# Patient Record
Sex: Female | Born: 1990 | Race: White | Hispanic: No | Marital: Single | Smoking: Never smoker
Health system: Southern US, Community
[De-identification: ages and names within clinical notes are randomized; demographics above are authoritative.]

## PROBLEM LIST (undated history)

## (undated) DIAGNOSIS — G90A Postural orthostatic tachycardia syndrome (POTS): Secondary | ICD-10-CM

## (undated) DIAGNOSIS — R569 Unspecified convulsions: Secondary | ICD-10-CM

## (undated) DIAGNOSIS — F4310 Post-traumatic stress disorder, unspecified: Secondary | ICD-10-CM

## (undated) DIAGNOSIS — I498 Other specified cardiac arrhythmias: Secondary | ICD-10-CM

## (undated) DIAGNOSIS — N83209 Unspecified ovarian cyst, unspecified side: Secondary | ICD-10-CM

## (undated) HISTORY — PX: INGUINAL HERNIA REPAIR: SUR1180

## (undated) HISTORY — PX: CHOLECYSTECTOMY: SHX55

---

## 2020-10-23 ENCOUNTER — Encounter: Payer: Self-pay | Admitting: Emergency Medicine

## 2020-10-23 ENCOUNTER — Other Ambulatory Visit: Payer: Self-pay

## 2020-10-23 ENCOUNTER — Observation Stay: Payer: Medicaid Other | Admitting: Anesthesiology

## 2020-10-23 ENCOUNTER — Observation Stay
Admission: EM | Admit: 2020-10-23 | Discharge: 2020-10-24 | Disposition: A | Discharge status: Home / Self Care | Payer: Medicaid Other | Attending: Surgery | Admitting: Surgery

## 2020-10-23 ENCOUNTER — Encounter
Admission: EM | Disposition: A | Discharge status: Home / Self Care | Payer: Self-pay | Source: Home / Self Care | Attending: Emergency Medicine

## 2020-10-23 ENCOUNTER — Emergency Department: Payer: Medicaid Other

## 2020-10-23 DIAGNOSIS — R1013 Epigastric pain: Secondary | ICD-10-CM | POA: Diagnosis present

## 2020-10-23 DIAGNOSIS — Z20822 Contact with and (suspected) exposure to covid-19: Secondary | ICD-10-CM | POA: Diagnosis not present

## 2020-10-23 DIAGNOSIS — K3580 Unspecified acute appendicitis: Principal | ICD-10-CM | POA: Diagnosis present

## 2020-10-23 HISTORY — DX: Postural orthostatic tachycardia syndrome (POTS): G90.A

## 2020-10-23 HISTORY — DX: Unspecified convulsions: R56.9

## 2020-10-23 HISTORY — DX: Post-traumatic stress disorder, unspecified: F43.10

## 2020-10-23 HISTORY — DX: Other specified cardiac arrhythmias: I49.8

## 2020-10-23 HISTORY — PX: LAPAROSCOPIC APPENDECTOMY: SHX408

## 2020-10-23 LAB — URINALYSIS, COMPLETE (UACMP) WITH MICROSCOPIC
Bilirubin Urine: NEGATIVE
Glucose, UA: NEGATIVE mg/dL
Hgb urine dipstick: NEGATIVE
Ketones, ur: NEGATIVE mg/dL
Leukocytes,Ua: NEGATIVE
Nitrite: NEGATIVE
Protein, ur: NEGATIVE mg/dL
Specific Gravity, Urine: 1.014 (ref 1.005–1.030)
pH: 6 (ref 5.0–8.0)

## 2020-10-23 LAB — HEPATIC FUNCTION PANEL
ALT: 18 U/L (ref 0–44)
AST: 22 U/L (ref 15–41)
Albumin: 4.7 g/dL (ref 3.5–5.0)
Alkaline Phosphatase: 50 U/L (ref 38–126)
Bilirubin, Direct: 0.1 mg/dL (ref 0.0–0.2)
Indirect Bilirubin: 0.6 mg/dL (ref 0.3–0.9)
Total Bilirubin: 0.7 mg/dL (ref 0.3–1.2)
Total Protein: 7.8 g/dL (ref 6.5–8.1)

## 2020-10-23 LAB — BASIC METABOLIC PANEL
Anion gap: 11 (ref 5–15)
BUN: 14 mg/dL (ref 6–20)
CO2: 24 mmol/L (ref 22–32)
Calcium: 9.3 mg/dL (ref 8.9–10.3)
Chloride: 102 mmol/L (ref 98–111)
Creatinine, Ser: 0.69 mg/dL (ref 0.44–1.00)
GFR, Estimated: >60 mL/min (ref >60)
Glucose, Bld: 96 mg/dL (ref 70–99)
Potassium: 4 mmol/L (ref 3.5–5.1)
Sodium: 137 mmol/L (ref 135–145)

## 2020-10-23 LAB — SARS CORONAVIRUS 2 BY RT PCR (HOSPITAL ORDER, PERFORMED IN ~~LOC~~ HOSPITAL LAB): SARS Coronavirus 2: NEGATIVE

## 2020-10-23 LAB — SURGICAL PCR SCREEN
MRSA, PCR: NEGATIVE
Staphylococcus aureus: NEGATIVE

## 2020-10-23 LAB — CBC
HCT: 39.2 % (ref 36.0–46.0)
Hemoglobin: 13.4 g/dL (ref 12.0–15.0)
MCH: 31.3 pg (ref 26.0–34.0)
MCHC: 34.2 g/dL (ref 30.0–36.0)
MCV: 91.6 fL (ref 80.0–100.0)
Platelets: 191 10*3/uL (ref 150–400)
RBC: 4.28 MIL/uL (ref 3.87–5.11)
RDW: 13.2 % (ref 11.5–15.5)
WBC: 13 10*3/uL — ABNORMAL HIGH (ref 4.0–10.5)
nRBC: 0 % (ref 0.0–0.2)

## 2020-10-23 LAB — PREGNANCY, URINE: Preg Test, Ur: NEGATIVE

## 2020-10-23 LAB — LIPASE, BLOOD: Lipase: 26 U/L (ref 11–51)

## 2020-10-23 SURGERY — APPENDECTOMY, LAPAROSCOPIC
Anesthesia: General

## 2020-10-23 MED ORDER — FENTANYL CITRATE (PF) 100 MCG/2ML IJ SOLN
INTRAMUSCULAR | Status: AC
  Filled 2020-10-23: qty 2

## 2020-10-23 MED ORDER — LIDOCAINE HCL (CARDIAC) PF 100 MG/5ML IV SOSY
PREFILLED_SYRINGE | INTRAVENOUS | Status: DC | PRN
  Administered 2020-10-23: 60 mg via INTRAVENOUS

## 2020-10-23 MED ORDER — SODIUM CHLORIDE 0.9 % IV SOLN: INTRAVENOUS | Status: DC

## 2020-10-23 MED ORDER — SODIUM CHLORIDE 0.9 % IV SOLN
12.5000 mg | Freq: Once | INTRAVENOUS | Status: AC
  Administered 2020-10-23: 12.5 mg via INTRAVENOUS
  Filled 2020-10-23: qty 0.5

## 2020-10-23 MED ORDER — OXYCODONE HCL 5 MG/5ML PO SOLN
5.0000 mg | Freq: Once | ORAL | Status: AC | PRN
Start: 2020-10-23 — End: 2020-10-23

## 2020-10-23 MED ORDER — MEPERIDINE HCL 25 MG/ML IJ SOLN
INTRAMUSCULAR | Status: AC
  Filled 2020-10-23: qty 1

## 2020-10-23 MED ORDER — BUPIVACAINE LIPOSOME 1.3 % IJ SUSP
INTRAMUSCULAR | Status: AC
  Filled 2020-10-23: qty 20

## 2020-10-23 MED ORDER — DEXAMETHASONE SODIUM PHOSPHATE 10 MG/ML IJ SOLN
INTRAMUSCULAR | Status: DC | PRN
  Administered 2020-10-23: 10 mg via INTRAVENOUS

## 2020-10-23 MED ORDER — ENOXAPARIN SODIUM 40 MG/0.4ML IJ SOSY
40.0000 mg | PREFILLED_SYRINGE | INTRAMUSCULAR | Status: DC
  Administered 2020-10-24: 40 mg via SUBCUTANEOUS
  Filled 2020-10-23: qty 0.4

## 2020-10-23 MED ORDER — HYDROMORPHONE HCL 1 MG/ML IJ SOLN
0.5000 mg | INTRAMUSCULAR | Status: DC | PRN
  Administered 2020-10-23 – 2020-10-24 (×5): 1 mg via INTRAVENOUS
  Filled 2020-10-23 (×5): qty 1

## 2020-10-23 MED ORDER — ONDANSETRON HCL 4 MG/2ML IJ SOLN
4.0000 mg | Freq: Once | INTRAMUSCULAR | Status: AC
  Administered 2020-10-23: 4 mg via INTRAVENOUS
  Filled 2020-10-23: qty 2

## 2020-10-23 MED ORDER — ROCURONIUM BROMIDE 100 MG/10ML IV SOLN
INTRAVENOUS | Status: DC | PRN
Start: 2020-10-23 — End: 2020-10-23
  Administered 2020-10-23: 30 mg via INTRAVENOUS

## 2020-10-23 MED ORDER — PROPOFOL 10 MG/ML IV BOLUS
INTRAVENOUS | Status: DC | PRN
  Administered 2020-10-23: 150 mg via INTRAVENOUS
  Administered 2020-10-23: 50 mg via INTRAVENOUS

## 2020-10-23 MED ORDER — IOHEXOL 350 MG/ML SOLN
80.0000 mL | Freq: Once | INTRAVENOUS | Status: AC | PRN
  Administered 2020-10-23: 80 mL via INTRAVENOUS
  Filled 2020-10-23: qty 80

## 2020-10-23 MED ORDER — BUPIVACAINE-EPINEPHRINE 0.25% -1:200000 IJ SOLN
INTRAMUSCULAR | Status: DC | PRN
  Administered 2020-10-23: 30 mL

## 2020-10-23 MED ORDER — FENTANYL CITRATE (PF) 100 MCG/2ML IJ SOLN
INTRAMUSCULAR | Status: DC | PRN
  Administered 2020-10-23: 100 ug via INTRAVENOUS

## 2020-10-23 MED ORDER — FENTANYL CITRATE (PF) 100 MCG/2ML IJ SOLN
INTRAMUSCULAR | Status: AC
  Administered 2020-10-23: 25 ug via INTRAVENOUS
  Filled 2020-10-23: qty 2

## 2020-10-23 MED ORDER — OXYCODONE HCL 5 MG PO TABS
5.0000 mg | ORAL_TABLET | ORAL | Status: DC | PRN
  Administered 2020-10-23: 5 mg via ORAL
  Administered 2020-10-24 (×2): 10 mg via ORAL
  Filled 2020-10-23: qty 1
  Filled 2020-10-23 (×2): qty 2

## 2020-10-23 MED ORDER — METRONIDAZOLE 500 MG/100ML IV SOLN
500.0000 mg | Freq: Once | INTRAVENOUS | Status: AC
  Administered 2020-10-23: 500 mg via INTRAVENOUS
  Filled 2020-10-23: qty 100

## 2020-10-23 MED ORDER — OXYCODONE HCL 5 MG PO TABS
ORAL_TABLET | ORAL | Status: AC
  Administered 2020-10-23: 5 mg via ORAL
  Filled 2020-10-23: qty 1

## 2020-10-23 MED ORDER — PIPERACILLIN-TAZOBACTAM 3.375 G IVPB
3.3750 g | Freq: Three times a day (TID) | INTRAVENOUS | Status: DC
  Administered 2020-10-24: 3.375 g via INTRAVENOUS
  Filled 2020-10-23 (×2): qty 50

## 2020-10-23 MED ORDER — BUPIVACAINE-EPINEPHRINE (PF) 0.25% -1:200000 IJ SOLN
INTRAMUSCULAR | Status: AC
  Filled 2020-10-23: qty 30

## 2020-10-23 MED ORDER — LACTATED RINGERS IV SOLN: INTRAVENOUS | Status: DC | PRN

## 2020-10-23 MED ORDER — MORPHINE SULFATE (PF) 4 MG/ML IV SOLN
4.0000 mg | Freq: Once | INTRAVENOUS | Status: AC
  Administered 2020-10-23: 4 mg via INTRAVENOUS
  Filled 2020-10-23: qty 1

## 2020-10-23 MED ORDER — FENTANYL CITRATE (PF) 100 MCG/2ML IJ SOLN
25.0000 ug | INTRAMUSCULAR | Status: DC | PRN
  Administered 2020-10-23: 25 ug via INTRAVENOUS
  Administered 2020-10-23: 50 ug via INTRAVENOUS
  Administered 2020-10-23: 25 ug via INTRAVENOUS

## 2020-10-23 MED ORDER — MEPERIDINE HCL 25 MG/ML IJ SOLN
12.5000 mg | Freq: Once | INTRAMUSCULAR | Status: AC
  Administered 2020-10-23: 12.5 mg via INTRAVENOUS

## 2020-10-23 MED ORDER — SUCCINYLCHOLINE CHLORIDE 200 MG/10ML IV SOSY
PREFILLED_SYRINGE | INTRAVENOUS | Status: DC | PRN
  Administered 2020-10-23: 100 mg via INTRAVENOUS

## 2020-10-23 MED ORDER — OXYCODONE HCL 5 MG PO TABS: 5.0000 mg | ORAL_TABLET | Freq: Once | ORAL | Status: AC | PRN

## 2020-10-23 MED ORDER — ACETAMINOPHEN 500 MG PO TABS
1000.0000 mg | ORAL_TABLET | Freq: Four times a day (QID) | ORAL | Status: DC
  Administered 2020-10-23 – 2020-10-24 (×5): 1000 mg via ORAL
  Filled 2020-10-23 (×5): qty 2

## 2020-10-23 MED ORDER — KETOROLAC TROMETHAMINE 30 MG/ML IJ SOLN
30.0000 mg | Freq: Four times a day (QID) | INTRAMUSCULAR | Status: DC
Start: 2020-10-24 — End: 2020-10-24
  Administered 2020-10-23 – 2020-10-24 (×2): 30 mg via INTRAVENOUS
  Filled 2020-10-23 (×3): qty 1

## 2020-10-23 MED ORDER — ONDANSETRON HCL 4 MG/2ML IJ SOLN
4.0000 mg | Freq: Four times a day (QID) | INTRAMUSCULAR | Status: DC | PRN
  Administered 2020-10-23 – 2020-10-24 (×3): 4 mg via INTRAVENOUS
  Filled 2020-10-23 (×3): qty 2

## 2020-10-23 MED ORDER — MIDAZOLAM HCL 2 MG/2ML IJ SOLN
INTRAMUSCULAR | Status: DC | PRN
  Administered 2020-10-23: 2 mg via INTRAVENOUS

## 2020-10-23 MED ORDER — MUPIROCIN 2 % EX OINT
1.0000 "application " | TOPICAL_OINTMENT | Freq: Two times a day (BID) | CUTANEOUS | Status: DC
  Filled 2020-10-23: qty 22

## 2020-10-23 MED ORDER — PHENYLEPHRINE HCL (PRESSORS) 10 MG/ML IV SOLN
INTRAVENOUS | Status: DC | PRN
Start: 2020-10-23 — End: 2020-10-23
  Administered 2020-10-23: 100 ug via INTRAVENOUS

## 2020-10-23 MED ORDER — DROPERIDOL 2.5 MG/ML IJ SOLN
0.6250 mg | Freq: Once | INTRAMUSCULAR | Status: DC | PRN
  Filled 2020-10-23: qty 2

## 2020-10-23 MED ORDER — PIPERACILLIN-TAZOBACTAM 3.375 G IVPB 30 MIN
3.3750 g | Freq: Once | INTRAVENOUS | Status: AC
  Administered 2020-10-23: 3.375 g via INTRAVENOUS
  Filled 2020-10-23: qty 50

## 2020-10-23 MED ORDER — SUGAMMADEX SODIUM 500 MG/5ML IV SOLN
INTRAVENOUS | Status: DC | PRN
Start: 2020-10-23 — End: 2020-10-23
  Administered 2020-10-23: 200 mg via INTRAVENOUS

## 2020-10-23 MED ORDER — CEFAZOLIN SODIUM-DEXTROSE 2-3 GM-%(50ML) IV SOLR
INTRAVENOUS | Status: DC | PRN
  Administered 2020-10-23: 2 g via INTRAVENOUS

## 2020-10-23 MED ORDER — ONDANSETRON 4 MG PO TBDP: 4.0000 mg | ORAL_TABLET | Freq: Four times a day (QID) | ORAL | Status: DC | PRN

## 2020-10-23 MED ORDER — MIDAZOLAM HCL 2 MG/2ML IJ SOLN
INTRAMUSCULAR | Status: AC
  Filled 2020-10-23: qty 2

## 2020-10-23 MED ORDER — BUPIVACAINE LIPOSOME 1.3 % IJ SUSP
INTRAMUSCULAR | Status: DC | PRN
  Administered 2020-10-23: 20 mL

## 2020-10-23 SURGICAL SUPPLY — 41 items
APPLIER CLIP 5 13 M/L LIGAMAX5 (MISCELLANEOUS)
BLADE CLIPPER SURG (BLADE) IMPLANT
CANISTER SUCT 1200ML W/VALVE (MISCELLANEOUS) IMPLANT
CHLORAPREP W/TINT 26 (MISCELLANEOUS) ×2 IMPLANT
CLIP APPLIE 5 13 M/L LIGAMAX5 (MISCELLANEOUS) IMPLANT
CUTTER FLEX LINEAR 45M (STAPLE) ×2 IMPLANT
DECANTER SPIKE VIAL GLASS SM (MISCELLANEOUS) ×2 IMPLANT
DERMABOND ADVANCED (GAUZE/BANDAGES/DRESSINGS) ×1
DERMABOND ADVANCED .7 DNX12 (GAUZE/BANDAGES/DRESSINGS) ×1 IMPLANT
ELECT CAUTERY BLADE 6.4 (BLADE) ×2 IMPLANT
ELECT REM PT RETURN 9FT ADLT (ELECTROSURGICAL) ×2
ELECTRODE REM PT RTRN 9FT ADLT (ELECTROSURGICAL) ×1 IMPLANT
GAUZE 4X4 16PLY ~~LOC~~+RFID DBL (SPONGE) IMPLANT
GLOVE SURG ENC MOIS LTX SZ7 (GLOVE) ×6 IMPLANT
GOWN STRL REUS W/ TWL LRG LVL3 (GOWN DISPOSABLE) ×3 IMPLANT
GOWN STRL REUS W/TWL LRG LVL3 (GOWN DISPOSABLE) ×3
IRRIGATION STRYKERFLOW (MISCELLANEOUS) ×1 IMPLANT
IRRIGATOR STRYKERFLOW (MISCELLANEOUS) ×2
IV NS 1000ML (IV SOLUTION) ×1
IV NS 1000ML BAXH (IV SOLUTION) ×1 IMPLANT
MANIFOLD NEPTUNE II (INSTRUMENTS) ×2 IMPLANT
NEEDLE HYPO 22GX1.5 SAFETY (NEEDLE) ×2 IMPLANT
NS IRRIG 500ML POUR BTL (IV SOLUTION) ×2 IMPLANT
PACK LAP CHOLECYSTECTOMY (MISCELLANEOUS) ×2 IMPLANT
PENCIL ELECTRO HAND CTR (MISCELLANEOUS) ×2 IMPLANT
POUCH SPECIMEN RETRIEVAL 10MM (ENDOMECHANICALS) ×2 IMPLANT
RELOAD 45 VASCULAR/THIN (ENDOMECHANICALS) IMPLANT
RELOAD STAPLE TA45 3.5 REG BLU (ENDOMECHANICALS) ×4 IMPLANT
SCISSORS METZENBAUM CVD 33 (INSTRUMENTS) IMPLANT
SHEARS HARMONIC ACE PLUS 36CM (ENDOMECHANICALS) ×2 IMPLANT
SLEEVE ENDOPATH XCEL 5M (ENDOMECHANICALS) ×2 IMPLANT
SLEEVE SCD COMPRESS KNEE MED (STOCKING) ×2 IMPLANT
SPONGE T-LAP 18X18 ~~LOC~~+RFID (SPONGE) ×2 IMPLANT
SUT MNCRL AB 4-0 PS2 18 (SUTURE) ×2 IMPLANT
SUT VICRYL 0 AB UR-6 (SUTURE) ×4 IMPLANT
SYR 20ML LL LF (SYRINGE) ×2 IMPLANT
TRAY FOLEY MTR SLVR 16FR STAT (SET/KITS/TRAYS/PACK) IMPLANT
TROCAR XCEL BLUNT TIP 100MML (ENDOMECHANICALS) ×2 IMPLANT
TROCAR XCEL NON-BLD 5MMX100MML (ENDOMECHANICALS) ×2 IMPLANT
TUBING EVAC SMOKE HEATED PNEUM (TUBING) ×2 IMPLANT
WATER STERILE IRR 500ML POUR (IV SOLUTION) IMPLANT

## 2020-10-23 NOTE — Anesthesia Procedure Notes (Signed)
Procedure Name: Intubation Date/Time: 10/23/2020 9:00 PM Performed by: Waldo Laine, CRNA Pre-anesthesia Checklist: Patient identified, Patient being monitored, Timeout performed, Emergency Drugs available and Suction available Patient Re-evaluated:Patient Re-evaluated prior to induction Oxygen Delivery Method: Circle system utilized Preoxygenation: Pre-oxygenation with 100% oxygen Induction Type: IV induction and Rapid sequence Laryngoscope Size: 3 and McGraph Grade View: Grade I Tube type: Oral Tube size: 6.5 mm Number of attempts: 1 Placement Confirmation: ETT inserted through vocal cords under direct vision, positive ETCO2 and breath sounds checked- equal and bilateral Secured at: 21 cm Tube secured with: Tape Dental Injury: Teeth and Oropharynx as per pre-operative assessment  Comments: Unremarkable RSI, MV not attempted

## 2020-10-23 NOTE — ED Notes (Signed)
Back from CT scan  States she is starting to feel nauseated

## 2020-10-23 NOTE — Transfer of Care (Signed)
Immediate Anesthesia Transfer of Care Note  Patient: Amy Salas  Procedure(s) Performed: APPENDECTOMY LAPAROSCOPIC  Patient Location: PACU  Anesthesia Type:General  Level of Consciousness: drowsy and patient cooperative  Airway & Oxygen Therapy: Patient Spontanous Breathing  Post-op Assessment: Report given to RN and Post -op Vital signs reviewed and stable  Post vital signs: Reviewed and stable  Last Vitals:  Vitals Value Taken Time  BP 101/70 10/23/20 2131  Temp    Pulse 89 10/23/20 2137  Resp 13 10/23/20 2137  SpO2 98 % 10/23/20 2137  Vitals shown include unvalidated device data.  Last Pain:  Vitals:   10/23/20 1943  TempSrc:   PainSc: 3       Patients Stated Pain Goal: 2 (10/23/20 1843)  Complications: No notable events documented.

## 2020-10-23 NOTE — Anesthesia Preprocedure Evaluation (Signed)
Anesthesia Evaluation  Patient identified by MRN, date of birth, ID band Patient awake    Reviewed: Allergy & Precautions, NPO status , Patient's Chart, lab work & pertinent test results  History of Anesthesia Complications Negative for: history of anesthetic complications  Airway Mallampati: I  TM Distance: >3 FB Neck ROM: Full    Dental no notable dental hx. (+) Teeth Intact   Pulmonary neg pulmonary ROS, neg sleep apnea, neg COPD, Patient abstained from smoking.Not current smoker,    Pulmonary exam normal breath sounds clear to auscultation       Cardiovascular Exercise Tolerance: Good METS(-) hypertension(-) CAD and (-) Past MI (-) dysrhythmias  Rhythm:Regular Rate:Normal - Systolic murmurs POTS, not on meds   Neuro/Psych PSYCHIATRIC DISORDERS Anxiety negative neurological ROS     GI/Hepatic neg GERD  ,(+)     (-) substance abuse  ,   Endo/Other  neg diabetes  Renal/GU negative Renal ROS     Musculoskeletal Patient says one time she woke up from sleep with her legs "paralyzed" and couldn't walk for two months. She says she had her calf muscles biopsied but never got a definitive answer as to what was wrong. Seems to have tolerated prior general anesthetics though no records of them here (hernia repairs, cyst removals)   Abdominal   Peds  Hematology   Anesthesia Other Findings Past Medical History: No date: POTS (postural orthostatic tachycardia syndrome) No date: PTSD (post-traumatic stress disorder) No date: Seizure (HCC)  Reproductive/Obstetrics                             Anesthesia Physical Anesthesia Plan  ASA: 2  Anesthesia Plan: General   Post-op Pain Management:    Induction: Intravenous and Rapid sequence  PONV Risk Score and Plan: 4 or greater and Ondansetron, Dexamethasone and Midazolam  Airway Management Planned: Oral ETT  Additional Equipment: None  Intra-op  Plan:   Post-operative Plan: Extubation in OR  Informed Consent: I have reviewed the patients History and Physical, chart, labs and discussed the procedure including the risks, benefits and alternatives for the proposed anesthesia with the patient or authorized representative who has indicated his/her understanding and acceptance.     Dental advisory given  Plan Discussed with: CRNA and Surgeon  Anesthesia Plan Comments: (Discussed risks of anesthesia with patient, including PONV, sore throat, lip/dental damage. Rare risks discussed as well, such as cardiorespiratory and neurological sequelae, and allergic reactions. Patient understands.)        Anesthesia Quick Evaluation

## 2020-10-23 NOTE — ED Provider Notes (Signed)
[facility] Emergency Department Provider Note   ____________________________________________   Event Date/Time   First MD Initiated Contact with Patient 10/23/20 [phone]     (approximate)  I have reviewed the triage vital signs and the nursing notes.   HISTORY  Chief Complaint Emesis and Flank Pain    HPI Amy Salas is a 30 y.o. female with past medical history of PTSD, POTS, and remote seizures who presents to the ED complaining of abdominal pain.  Patient reports that she was woken from sleep last night with pain originating in her epigastrium and radiating around both sides of her upper abdomen into both flanks.  Pain has been constant and severe since then, associated with nausea and vomiting.  She denies any associated diarrhea and has not had any dysuria, vaginal bleeding, or vaginal discharge.  She reports similar pain about 2 years ago, but was never told what the cause was.  She still has her gallbladder, denies ever being told that she has gallstones in the past.  She denies significant alcohol abuse or NSAID use.  She has not had any fevers, cough, chest pain, or shortness of breath.        Past Medical History:  Diagnosis Date   POTS (postural orthostatic tachycardia syndrome)    PTSD (post-traumatic stress disorder)    Seizure (HCC)     There are no problems to display for this patient.   Past Surgical History:  Procedure Laterality Date   INGUINAL HERNIA REPAIR Bilateral     Prior to Admission medications   Not on File    Allergies Bactrim [sulfamethoxazole-trimethoprim] and Sulfa antibiotics  No family history on file.  Social History    Review of Systems  Constitutional: No fever/chills Eyes: No visual changes. ENT: No sore throat. Cardiovascular: Denies chest pain. Respiratory: Denies shortness of breath. Gastrointestinal: Positive for abdominal pain, nausea, and vomiting.  No diarrhea.  No  constipation. Genitourinary: Negative for dysuria. Musculoskeletal: Negative for back pain. Skin: Negative for rash. Neurological: Negative for headaches, focal weakness or numbness.  ____________________________________________   PHYSICAL EXAM:  VITAL SIGNS: ED Triage Vitals [10/23/20 0923]  Enc Vitals Group     BP 108/82     Pulse Rate 82     Resp 18     Temp 98.5 F (36.9 C)     Temp Source Oral     SpO2 98 %     Weight 130 lb (59 kg)     Height 5\' 9"  (1.753 m)     Head Circumference      Peak Flow      Pain Score 10     Pain Loc      Pain Edu?      Excl. in GC?     Constitutional: Alert and oriented. Eyes: Conjunctivae are normal. Head: Atraumatic. Nose: No congestion/rhinnorhea. Mouth/Throat: Mucous membranes are moist. Neck: Normal ROM Cardiovascular: Normal rate, regular rhythm. Grossly normal heart sounds. Respiratory: Normal respiratory effort.  No retractions. Lungs CTAB. Gastrointestinal: Soft and diffusely tender to palpation, greatest in the epigastrium.  CVA tenderness noted bilaterally. No distention. Genitourinary: deferred Musculoskeletal: No lower extremity tenderness nor edema. Neurologic:  Normal speech and language. No gross focal neurologic deficits are appreciated. Skin:  Skin is warm, dry and intact. No rash noted. Psychiatric: Mood and affect are normal. Speech and behavior are normal.  ____________________________________________   LABS (all labs ordered are listed, but only abnormal results are displayed)  Labs Reviewed  URINALYSIS, COMPLETE (UACMP) WITH MICROSCOPIC - Abnormal; Notable for the following components:      Result Value   Color, Urine YELLOW (*)    APPearance HAZY (*)    Bacteria, UA RARE (*)    All other components within normal limits  CBC - Abnormal; Notable for the following components:   WBC 13.0 (*)    All other components within normal limits  BASIC METABOLIC PANEL  HEPATIC FUNCTION PANEL  LIPASE, BLOOD   PREGNANCY, URINE    PROCEDURES  Procedure(s) performed (including Critical Care):  Procedures   ____________________________________________   INITIAL IMPRESSION / ASSESSMENT AND PLAN / ED COURSE      30 year old female with past medical history of POTS, PTSD, remote seizures who presents to the ED complaining of cute onset abdominal pain overnight associated with nausea and multiple episodes of vomiting.  Patient has diffuse tenderness on exam, we will further assess with labs, UA, pregnancy testing, and CT scan.  Differential includes cholecystitis, biliary colic, pancreatitis, gastritis, pyelonephritis, and kidney stones.  We will treat symptomatically with IV morphine and Zofran, reassess following CT.  CT scan concerning for acute appendicitis with appendiceal dilation and associated inflammatory changes.  We will give dose of IV Zosyn, patient complains of ongoing pain and nausea which we will treat with Phenergan and additional morphine.  Case discussed with Dr. Everlene Farrier of general surgery, who will evaluate the patient.      ____________________________________________   FINAL CLINICAL IMPRESSION(S) / ED DIAGNOSES  Final diagnoses:  Acute appendicitis, unspecified acute appendicitis type     ED Discharge Orders     None        Note:  This document was prepared using Dragon voice recognition software and may include unintentional dictation errors.    Chesley Noon, MD 10/23/20 [phone]

## 2020-10-23 NOTE — ED Triage Notes (Signed)
Pt here with left flank pain and emesis. PT states the pain started last night and is central in nature and does not radiate. Pt tearful in triage.

## 2020-10-23 NOTE — Op Note (Signed)
laparascopic appendectomy   [name] Date of operation:  10/23/2020  Indications: The patient presented with a history of  abdominal pain. Workup has revealed findings consistent with acute appendicitis.  Pre-operative Diagnosis: Acute appendicitis without mention of peritonitis  Post-operative Diagnosis: Same  Surgeon: Sterling Big, MD, FACS  Anesthesia: General with endotracheal tube  Findings: Acute non perforated appendicitis  Estimated Blood Loss: 5cc         Specimens: appendix         Complications:  none  Procedure Details  The patient was seen again in the preop area. The options of surgery versus observation were reviewed with the patient and/or family. The risks of bleeding, infection, recurrence of symptoms, negative laparoscopy, potential for an open procedure, bowel injury, abscess or infection, were all reviewed as well. The patient was taken to Operating Room, identified as Amy Salas and the procedure verified as laparoscopic appendectomy. A Time Out was held and the above information confirmed.  The patient was placed in the supine position and general anesthesia was induced.  Antibiotic prophylaxis was administered and VT E prophylaxis was in place.  The abdomen was prepped and draped in a sterile fashion. An infraumbilical incision was made. A cutdown technique was used to enter the abdominal cavity. Two vicryl stitches were placed on the fascia and a Hasson trocar inserted. Pneumoperitoneum obtained. Two 5 mm ports were placed under direct visualization.  The appendix was identified and found to be acutely inflamed  The appendix was carefully dissected. The mesoappendix was divided withHarmonic scalpel. The base of the appendix was dissected out and divided with a standard load Endo GIA.The appendix was placed in a Endo Catch bag and removed via the Hasson port. The right lower quadrant and pelvis was then irrigated with  normal  saline which was aspirated. Inspection  failed to identify any additional bleeding and there were no signs of bowel injury. Again the right lower quadrant was inspected there was no sign of bleeding or bowel injury therefore pneumoperitoneum was released, all ports were removed.  The umbilical fascia was closed with 0 Vicryl interrupted sutures and the skin incisions were approximated with subcuticular 4-0 Monocryl. Dermabond was placed The patient tolerated the procedure well, there were no complications. The sponge lap and needle count were correct at the end of the procedure.  The patient was taken to the recovery room in stable condition to be admitted for continued care.    Sterling Big, MD FACS

## 2020-10-23 NOTE — H&P (Signed)
[facility] SURGICAL HISTORY & PHYSICAL (cpt [phone])  HISTORY OF PRESENT ILLNESS (HPI):  30 y.o. female presented to [facility] ED today for abdominal pain. Patient reports she awoke from sleep this morning with severe epigastric and upper abdominal pain which radiated down her flanks. This was sharp in nature. Nothing seemed to give her any relief. Movement is exacerbating the pain. This was accompanied with multiple episodes of nausea and emesis. No fever, chills, cough, CP, SOB, or urinary changes. No previous intra-abdominal surgeries. Work up in the ED revealed a leukocytosis to 13.0K. CT Abdomen/Pelvis was concerning for acute uncomplicated appendicitis.   General surgery is consulted by emergency medicine physician Dr Chesley Noon, MD for evaluation and management of acute uncomplicated appendicitis.    PAST MEDICAL HISTORY (PMH):  Past Medical History:  Diagnosis Date   POTS (postural orthostatic tachycardia syndrome)    PTSD (post-traumatic stress disorder)    Seizure (HCC)     Reviewed. Otherwise negative.   PAST SURGICAL HISTORY (PSH):  Past Surgical History:  Procedure Laterality Date   INGUINAL HERNIA REPAIR Bilateral     Reviewed. Otherwise negative.   MEDICATIONS:  Prior to Admission medications   Not on File     ALLERGIES:  Allergies  Allergen Reactions   Bactrim [Sulfamethoxazole-Trimethoprim] Other (See Comments)    seizure   Sulfa Antibiotics     seizures     SOCIAL HISTORY:  Social History   Socioeconomic History   Marital status: Single    Spouse name: Not on file   Number of children: Not on file   Years of education: Not on file   Highest education level: Not on file  Occupational History   Not on file  Tobacco Use   Smoking status: Not on file   Smokeless tobacco: Not on file  Substance and Sexual Activity   Alcohol use: Not on file   Drug use: Not on file   Sexual activity: Not on file  Other Topics Concern   Not on file   Social History Narrative   Not on file   Social Determinants of Health   Financial Resource Strain: Not on file  Food Insecurity: Not on file  Transportation Needs: Not on file  Physical Activity: Not on file  Stress: Not on file  Social Connections: Not on file  Intimate Partner Violence: Not on file     FAMILY HISTORY:  No family history on file.  Otherwise negative.   REVIEW OF SYSTEMS:  Review of Systems  Constitutional:  Negative for chills and fever.  HENT:  Negative for congestion and sore throat.   Respiratory:  Negative for cough and shortness of breath.   Cardiovascular:  Negative for chest pain and palpitations.  Gastrointestinal:  Positive for abdominal pain, nausea and vomiting. Negative for blood in stool, constipation and diarrhea.  Genitourinary:  Negative for dysuria and urgency.  All other systems reviewed and are negative.  VITAL SIGNS:  Temp:  [98.5 F (36.9 C)] 98.5 F (36.9 C) (08/23 0923) Pulse Rate:  [72-82] 72 (08/23 1240) Resp:  [18] 18 (08/23 1240) BP: (98-108)/(61-82) 98/61 (08/23 1240) SpO2:  [98 %] 98 % (08/23 1240) Weight:  [59 kg] 59 kg (08/23 0923)     Height: 5\' 9"  (175.3 cm) Weight: 59 kg BMI (Calculated): 19.19   PHYSICAL EXAM:  Physical Exam Vitals and nursing note reviewed. Exam conducted with a chaperone present.  Constitutional:      General: She is not in acute distress.  Appearance: Normal appearance. She is normal weight. She is not ill-appearing.  HENT:     Head: Normocephalic and atraumatic.  Eyes:     General: No scleral icterus.    Conjunctiva/sclera: Conjunctivae normal.  Cardiovascular:     Rate and Rhythm: Normal rate and regular rhythm.     Pulses: Normal pulses.     Heart sounds: No murmur heard. Pulmonary:     Effort: Pulmonary effort is normal. No respiratory distress.  Abdominal:     General: Abdomen is flat. There is no distension.     Palpations: Abdomen is soft.     Tenderness: There is abdominal  tenderness in the right lower quadrant. There is no guarding or rebound. Positive signs include McBurney's sign.  Genitourinary:    Comments: Deferred Musculoskeletal:     Right lower leg: No edema.     Left lower leg: No edema.  Skin:    General: Skin is warm and dry.     Coloration: Skin is not pale.     Findings: No erythema.  Neurological:     General: No focal deficit present.     Mental Status: She is alert and oriented to person, place, and time.  Psychiatric:        Mood and Affect: Mood normal.        Behavior: Behavior normal.    INTAKE/OUTPUT:  This shift: Total I/O In: 50 [IV Piggyback:50] Out: -   Last 2 shifts: @IOLAST2SHIFTS @  Labs:  CBC Latest Ref Rng & Units 10/23/2020  WBC 4.0 - 10.5 K/uL 13.0(H)  Hemoglobin 12.0 - 15.0 g/dL 10/25/2020  Hematocrit 00.9 - 46.0 % 39.2  Platelets 150 - 400 K/uL 191   CMP Latest Ref Rng & Units 10/23/2020  Glucose 70 - 99 mg/dL 96  BUN 6 - 20 mg/dL 14  Creatinine 10/25/2020 - 8.29 mg/dL 9.37  Sodium 1.69 - 678 mmol/L 137  Potassium 3.5 - 5.1 mmol/L 4.0  Chloride 98 - 111 mmol/L 102  CO2 22 - 32 mmol/L 24  Calcium 8.9 - 10.3 mg/dL 9.3  Total Protein 6.5 - 8.1 g/dL 7.8  Total Bilirubin 0.3 - 1.2 mg/dL 0.7  Alkaline Phos 38 - 126 U/L 50  AST 15 - 41 U/L 22  ALT 0 - 44 U/L 18     Imaging studies:   CT Abdomen/Pelvis (08/23/20220) personally reviewed which shows dilated and inflamed appendix with surrounding  stranding, no abscess, no pneumoperitoneum, and radiologist report pending:   Assessment/Plan: (ICD-10's: K35.80) 30 y.o. female with leukocytosis, abdominal pain, nausea, and emesis found to have acute uncomplicated appendicitis.   - Will admit to general surgery - Will plan for laparoscopic appendectomy this afternoon with Dr 37 pending OR/Anesthesia availability - All risks, benefits, and alternatives to above procedure(s) were discussed with the patient, all of her questions were answered to her expressed satisfaction,  patient expresses she wishes to proceed, and informed consent was obtained.    - NPO + IVF Resuscitation - IV Abx (Zosyn) - Monitor abdominal examination; on-going bowel function - Pain control prn; antiemetics prn   - Mobilization as tolerated   - DVT prophylaxis; hold for OR  All of the above findings and recommendations were discussed with the patient, and all of her questions were answered to her expressed satisfaction.  -- Everlene Farrier, PA-C  [facility] 10/23/2020, 1:27 PM [phone] M-F: 7am - 4pm

## 2020-10-24 ENCOUNTER — Encounter: Payer: Self-pay | Admitting: Surgery

## 2020-10-24 LAB — CBC
HCT: 34.8 % — ABNORMAL LOW (ref 36.0–46.0)
Hemoglobin: 11.3 g/dL — ABNORMAL LOW (ref 12.0–15.0)
MCH: 29.9 pg (ref 26.0–34.0)
MCHC: 32.5 g/dL (ref 30.0–36.0)
MCV: 92.1 fL (ref 80.0–100.0)
Platelets: 205 10*3/uL (ref 150–400)
RBC: 3.78 MIL/uL — ABNORMAL LOW (ref 3.87–5.11)
RDW: 13.3 % (ref 11.5–15.5)
WBC: 7.6 10*3/uL (ref 4.0–10.5)
nRBC: 0 % (ref 0.0–0.2)

## 2020-10-24 LAB — BASIC METABOLIC PANEL
Anion gap: 8 (ref 5–15)
BUN: 12 mg/dL (ref 6–20)
CO2: 23 mmol/L (ref 22–32)
Calcium: 8.6 mg/dL — ABNORMAL LOW (ref 8.9–10.3)
Chloride: 104 mmol/L (ref 98–111)
Creatinine, Ser: 0.74 mg/dL (ref 0.44–1.00)
GFR, Estimated: >60 mL/min (ref >60)
Glucose, Bld: 138 mg/dL — ABNORMAL HIGH (ref 70–99)
Potassium: 4 mmol/L (ref 3.5–5.1)
Sodium: 135 mmol/L (ref 135–145)

## 2020-10-24 MED ORDER — AMOXICILLIN-POT CLAVULANATE 875-125 MG PO TABS: 1.0000 | ORAL_TABLET | Freq: Two times a day (BID) | ORAL | 0 refills | Status: AC

## 2020-10-24 MED ORDER — PREGABALIN 50 MG PO CAPS
100.0000 mg | ORAL_CAPSULE | Freq: Three times a day (TID) | ORAL | Status: DC
  Administered 2020-10-24: 100 mg via ORAL
  Filled 2020-10-24: qty 2

## 2020-10-24 MED ORDER — OXYCODONE HCL 5 MG PO TABS: 5.0000 mg | ORAL_TABLET | ORAL | 0 refills | Status: AC | PRN

## 2020-10-24 MED ORDER — ONDANSETRON 8 MG PO TBDP: 8.0000 mg | ORAL_TABLET | Freq: Three times a day (TID) | ORAL | 0 refills | Status: AC | PRN

## 2020-10-24 MED ORDER — IBUPROFEN 600 MG PO TABS: 600.0000 mg | ORAL_TABLET | Freq: Four times a day (QID) | ORAL | 0 refills | Status: DC | PRN

## 2020-10-24 MED ORDER — SODIUM CHLORIDE 0.9 % IV BOLUS
500.0000 mL | Freq: Once | INTRAVENOUS | Status: AC
  Administered 2020-10-24: 500 mL via INTRAVENOUS

## 2020-10-24 MED ORDER — CYCLOBENZAPRINE HCL 10 MG PO TABS
5.0000 mg | ORAL_TABLET | Freq: Three times a day (TID) | ORAL | Status: DC
  Administered 2020-10-24 (×2): 5 mg via ORAL
  Filled 2020-10-24 (×2): qty 1

## 2020-10-24 NOTE — Discharge Instructions (Signed)
In addition to included general post-operative instructions,  Diet: Resume home diet.   Activity: No heavy lifting >20 pounds (children, pets, laundry, garbage) or strenuous activity for 4 weeks, but light activity and walking are encouraged. Do not drive or drink alcohol if taking narcotic pain medications or having pain that might distract from driving.  Wound care: 2 days after surgery (08/25), you may shower/get incision wet with soapy water and pat dry (do not rub incisions), but no baths or submerging incision underwater until follow-up.   Medications: Resume all home medications. For mild to moderate pain: acetaminophen (Tylenol) or ibuprofen/naproxen (if no kidney disease). Combining Tylenol with alcohol can substantially increase your risk of causing liver disease. Narcotic pain medications, if prescribed, can be used for severe pain, though may cause nausea, constipation, and drowsiness. Do not combine Tylenol and Percocet (or similar) within a 6 hour period as Percocet (and similar) contain(s) Tylenol. If you do not need the narcotic pain medication, you do not need to fill the prescription.  Call office [phone] / [phone]) at any time if any questions, worsening pain, fevers/chills, bleeding, drainage from incision site, or other concerns.

## 2020-10-24 NOTE — Discharge Summary (Signed)
[facility] SURGICAL DISCHARGE SUMMARY  Patient ID: Amy Salas MRN: [id number] DOB/AGE: [birth year] 30 y.o.  Admit date: 10/23/2020 Discharge date: 10/24/2020  Discharge Diagnoses Patient Active Problem List   Diagnosis Date Noted   Acute appendicitis 10/23/2020    Consultants None  Procedures 10/23/2020:  Laparoscopic Appendectomy   HPI: 30 y.o. female presented to [facility] ED today for abdominal pain. Patient reports she awoke from sleep this morning with severe epigastric and upper abdominal pain which radiated down her flanks. This was sharp in nature. Nothing seemed to give her any relief. Movement is exacerbating the pain. This was accompanied with multiple episodes of nausea and emesis. No fever, chills, cough, CP, SOB, or urinary changes. No previous intra-abdominal surgeries. Work up in the ED revealed a leukocytosis to 13.0K. CT Abdomen/Pelvis was concerning for acute uncomplicated appendicitis.   Hospital Course: Informed consent was obtained and documented, and patient underwent uneventful laparoscopic appendectomy (Dr Everlene Farrier, 10/23/2020).  Post-operatively, patient's pain/symptoms improved/resolved and advancement of patient's diet and ambulation were well-tolerated. The remainder of patient's hospital course was essentially unremarkable, and discharge planning was initiated accordingly with patient safely able to be discharged home with appropriate discharge instructions, antibiotics (Augmentin x7 days), pain control, and outpatient follow-up after all of her questions were answered to her expressed satisfaction.   Discharge Condition: Good   Physical Examination:  Constitutional: Well appearing female, NAD Pulmonary: Normal effort, no respiratory distress Gastrointestinal: Soft, incisional soreness, non-distended, no rebound/guarding Skin: Laparoscopic incisions are CDI with dermabond, no erythema, slight serous drainage at umbilicus     Allergies as of 10/24/2020       Reactions   Bactrim [sulfamethoxazole-trimethoprim] Other (See Comments)   seizure   Sulfa Antibiotics    seizures        Medication List     TAKE these medications    amoxicillin-clavulanate 875-125 MG tablet Commonly known as: Augmentin Take 1 tablet by mouth 2 (two) times daily for 7 days.   ibuprofen 600 MG tablet Commonly known as: ADVIL Take 1 tablet (600 mg total) by mouth every 6 (six) hours as needed.   ondansetron 8 MG disintegrating tablet Commonly known as: ZOFRAN-ODT Take 1 tablet (8 mg total) by mouth every 8 (eight) hours as needed for nausea or vomiting.   oxyCODONE 5 MG immediate release tablet Commonly known as: Oxy IR/ROXICODONE Take 1 tablet (5 mg total) by mouth every 4 (four) hours as needed for severe pain or breakthrough pain.   valACYclovir 500 MG tablet Commonly known as: VALTREX Take 500 mg by mouth 2 (two) times daily as needed.          Follow-up Information     Donovan Kail, PA-C. Schedule an appointment as soon as possible for a visit in 2 week(s).   Specialty: Physician Assistant Why: s/p laparoscopic appendectomy Contact information: [address] [place] [address] [phone]                  Time spent on discharge management including discussion of hospital course, clinical condition, outpatient instructions, prescriptions, and follow up with the patient and members of the medical team: >30 minutes  -- Lynden Oxford , PA-C  [facility]  10/24/2020, 12:31 PM [phone] M-F: 7am - 4pm

## 2020-10-25 LAB — SURGICAL PATHOLOGY

## 2020-10-25 NOTE — Anesthesia Postprocedure Evaluation (Signed)
Anesthesia Post Note  Patient: Amy Salas  Procedure(s) Performed: APPENDECTOMY LAPAROSCOPIC  Patient location during evaluation: PACU Anesthesia Type: General Level of consciousness: awake and alert Pain management: pain level controlled Vital Signs Assessment: post-procedure vital signs reviewed and stable Respiratory status: spontaneous breathing, nonlabored ventilation, respiratory function stable and patient connected to nasal cannula oxygen Cardiovascular status: blood pressure returned to baseline and stable Postop Assessment: no apparent nausea or vomiting Anesthetic complications: no   No notable events documented.   Last Vitals:  Vitals:   10/24/20 0424 10/24/20 0737  BP: 97/64 103/64  Pulse: 78 77  Resp: 20 18  Temp: 36.8 C 36.7 C  SpO2: 100% 99%    Last Pain:  Vitals:   10/24/20 1515  TempSrc:   PainSc: 7                  Corinda Gubler

## 2020-12-27 ENCOUNTER — Emergency Department
Admission: EM | Admit: 2020-12-27 | Discharge: 2020-12-27 | Disposition: A | Discharge status: Home / Self Care | Payer: Medicaid Other | Attending: Emergency Medicine | Admitting: Emergency Medicine

## 2020-12-27 ENCOUNTER — Encounter: Payer: Self-pay | Admitting: Emergency Medicine

## 2020-12-27 ENCOUNTER — Other Ambulatory Visit: Payer: Self-pay

## 2020-12-27 ENCOUNTER — Emergency Department: Payer: Medicaid Other

## 2020-12-27 DIAGNOSIS — G8918 Other acute postprocedural pain: Secondary | ICD-10-CM | POA: Diagnosis not present

## 2020-12-27 DIAGNOSIS — R109 Unspecified abdominal pain: Secondary | ICD-10-CM | POA: Insufficient documentation

## 2020-12-27 DIAGNOSIS — R1110 Vomiting, unspecified: Secondary | ICD-10-CM | POA: Insufficient documentation

## 2020-12-27 LAB — PREGNANCY, URINE: Preg Test, Ur: NEGATIVE

## 2020-12-27 LAB — CBC
HCT: 40 % (ref 36.0–46.0)
Hemoglobin: 13.4 g/dL (ref 12.0–15.0)
MCH: 30.2 pg (ref 26.0–34.0)
MCHC: 33.5 g/dL (ref 30.0–36.0)
MCV: 90.1 fL (ref 80.0–100.0)
Platelets: 214 10*3/uL (ref 150–400)
RBC: 4.44 MIL/uL (ref 3.87–5.11)
RDW: 13.7 % (ref 11.5–15.5)
WBC: 5.1 10*3/uL (ref 4.0–10.5)
nRBC: 0 % (ref 0.0–0.2)

## 2020-12-27 LAB — COMPREHENSIVE METABOLIC PANEL
ALT: 12 U/L (ref 0–44)
AST: 15 U/L (ref 15–41)
Albumin: 4.4 g/dL (ref 3.5–5.0)
Alkaline Phosphatase: 52 U/L (ref 38–126)
Anion gap: 6 (ref 5–15)
BUN: 8 mg/dL (ref 6–20)
CO2: 25 mmol/L (ref 22–32)
Calcium: 9.2 mg/dL (ref 8.9–10.3)
Chloride: 105 mmol/L (ref 98–111)
Creatinine, Ser: 0.57 mg/dL (ref 0.44–1.00)
GFR, Estimated: >60 mL/min (ref >60)
Glucose, Bld: 99 mg/dL (ref 70–99)
Potassium: 4.3 mmol/L (ref 3.5–5.1)
Sodium: 136 mmol/L (ref 135–145)
Total Bilirubin: 0.6 mg/dL (ref 0.3–1.2)
Total Protein: 7.3 g/dL (ref 6.5–8.1)

## 2020-12-27 LAB — URINALYSIS, ROUTINE W REFLEX MICROSCOPIC
Bilirubin Urine: NEGATIVE
Glucose, UA: NEGATIVE mg/dL
Hgb urine dipstick: NEGATIVE
Ketones, ur: NEGATIVE mg/dL
Leukocytes,Ua: NEGATIVE
Nitrite: NEGATIVE
Protein, ur: NEGATIVE mg/dL
Specific Gravity, Urine: 1.005 (ref 1.005–1.030)
pH: 7 (ref 5.0–8.0)

## 2020-12-27 LAB — LIPASE, BLOOD: Lipase: 25 U/L (ref 11–51)

## 2020-12-27 MED ORDER — LORAZEPAM 2 MG/ML IJ SOLN
1.0000 mg | Freq: Once | INTRAMUSCULAR | Status: AC
  Administered 2020-12-27: 1 mg via INTRAVENOUS
  Filled 2020-12-27: qty 1

## 2020-12-27 MED ORDER — ONDANSETRON HCL 4 MG/2ML IJ SOLN
4.0000 mg | Freq: Once | INTRAMUSCULAR | Status: AC
  Administered 2020-12-27: 4 mg via INTRAVENOUS
  Filled 2020-12-27: qty 2

## 2020-12-27 MED ORDER — IOHEXOL 300 MG/ML  SOLN
80.0000 mL | Freq: Once | INTRAMUSCULAR | Status: AC | PRN
  Administered 2020-12-27: 80 mL via INTRAVENOUS
  Filled 2020-12-27: qty 80

## 2020-12-27 MED ORDER — CEPHALEXIN 500 MG PO CAPS: 500.0000 mg | ORAL_CAPSULE | Freq: Three times a day (TID) | ORAL | 0 refills | Status: DC

## 2020-12-27 MED ORDER — MORPHINE SULFATE (PF) 4 MG/ML IV SOLN
4.0000 mg | Freq: Once | INTRAVENOUS | Status: AC
  Administered 2020-12-27: 4 mg via INTRAVENOUS
  Filled 2020-12-27: qty 1

## 2020-12-27 NOTE — ED Provider Notes (Signed)
[facility] Emergency Department Provider Note  Time seen: 11:22 AM  I have reviewed the triage vital signs and the nursing notes.   HISTORY  Chief Complaint Abdominal Pain, Emesis, and Post-op Problem   HPI Amy Salas is a 30 y.o. female with a past medical history of PTSD, presents to the emergency department for right-sided abdominal discomfort/drainage 1 month after a laparoscopic cholecystectomy.  According to the patient approximately 1 month ago she had a laparoscopic cholecystectomy in [place].  She states over the past several days she has noticed a mild amount of redness around the right mid abdominal incision and since yesterday a small amount of drainage from the incision.  Patient called her surgeon in [place] and they referred her to a local emergency department.  Patient denies any fever nausea vomiting or diarrhea.   Past Medical History:  Diagnosis Date   POTS (postural orthostatic tachycardia syndrome)    PTSD (post-traumatic stress disorder)    Seizure [facility])     Patient Active Problem List   Diagnosis Date Noted   Acute appendicitis 10/23/2020    Past Surgical History:  Procedure Laterality Date   CHOLECYSTECTOMY     INGUINAL HERNIA REPAIR Bilateral    LAPAROSCOPIC APPENDECTOMY N/A 10/23/2020   Procedure: APPENDECTOMY LAPAROSCOPIC;  Surgeon: Leafy Ro, MD;  Location: [facility] ORS;  Service: General;  Laterality: N/A;    Prior to Admission medications   Medication Sig Start Date End Date Taking? Authorizing Provider  ibuprofen (ADVIL) 600 MG tablet Take 1 tablet (600 mg total) by mouth every 6 (six) hours as needed. 10/24/20   Donovan Kail, PA-C  ondansetron (ZOFRAN-ODT) 8 MG disintegrating tablet Take 1 tablet (8 mg total) by mouth every 8 (eight) hours as needed for nausea or vomiting. 10/24/20   Donovan Kail, PA-C  oxyCODONE (OXY IR/ROXICODONE) 5 MG immediate release tablet Take 1 tablet (5 mg total) by mouth  every 4 (four) hours as needed for severe pain or breakthrough pain. 10/24/20   Donovan Kail, PA-C  valACYclovir (VALTREX) 500 MG tablet Take 500 mg by mouth 2 (two) times daily as needed.    [provider]    Allergies  Allergen Reactions   Bactrim [Sulfamethoxazole-Trimethoprim] Other (See Comments)    seizure   Sulfa Antibiotics     seizures    No family history on file.  Social History Social History   Tobacco Use   Smoking status: Never   Smokeless tobacco: Never    Review of Systems Constitutional: Negative for fever. Cardiovascular: Negative for chest pain. Respiratory: Negative for shortness of breath. Gastrointestinal: Mild dull right mid abdominal discomfort around the incision site.  Small mount of drainage to the right sided incision. Genitourinary: Negative for urinary compaints Musculoskeletal: Negative for musculoskeletal complaints Neurological: Negative for headache All other ROS negative  ____________________________________________   PHYSICAL EXAM:  VITAL SIGNS: ED Triage Vitals  Enc Vitals Group     BP 12/27/20 1012 124/76     Pulse Rate 12/27/20 1012 98     Resp 12/27/20 1012 16     Temp 12/27/20 1012 98.6 F (37 C)     Temp src --      SpO2 12/27/20 1012 100 %     Weight 12/27/20 1012 135 lb (61.2 kg)     Height 12/27/20 1012 5\' 9"  (1.753 m)     Head Circumference --      Peak Flow --  Pain Score 12/27/20 1011 6     Pain Loc --      Pain Edu? --      Excl. in GC? --    Constitutional: Alert and oriented. Well appearing and in no distress. Eyes: Normal exam ENT      Head: Normocephalic and atraumatic.      Mouth/Throat: Mucous membranes are moist. Cardiovascular: Normal rate, regular rhythm.  Respiratory: Normal respiratory effort without tachypnea nor retractions. Breath sounds are clear  Gastrointestinal: Soft, large nontender abdomen besides around the right mid abdominal incision which is approximately 2 to 3 cm  in length.  Patient does have induration beneath the incision concerning for possible seroma versus abscess. Musculoskeletal: Nontender with normal range of motion in all extremities.  Neurologic:  Normal speech and language. No gross focal neurologic deficits Skin:  Skin is warm, dry and intact.  Psychiatric: Mood and affect are normal.   ____________________________________________     RADIOLOGY  CT shows fat stranding and skin thickening at the site of prior surgical laparoscopy port incision, otherwise no acute process.  ____________________________________________   INITIAL IMPRESSION / ASSESSMENT AND PLAN / ED COURSE  Pertinent labs & imaging results that were available during my care of the patient were reviewed by me and considered in my medical decision making (see chart for details).   Patient presents emergency department for right-sided abdominal pain and discharge from her right-sided incision from a laparoscopic cholecystectomy 1 month ago.  Patient does have a slight amount of erythema around the incision as well as noted some drainage yesterday and today, none currently.  Concern for postsurgical abscess versus seroma versus fistula.  We will obtain CT imaging with contrast to further evaluate.  Patient agreeable to plan of care.  Shortly after obtaining her CT imaging patient is complaining of abdominal pain appears very anxious.  Patient given Ativan and now states that her pain is gone.  Patient appears much more calm.  CT pending.   CT largely reassuring.  Given the fat stranding the patient's complaint of increased pain at that site we will discharge with antibiotics as a precaution have the patient follow-up with her surgeon.  I discussed return precautions.  Amy Salas was evaluated in Emergency Department on 12/27/2020 for the symptoms described in the history of present illness. She was evaluated in the context of the global COVID-19 pandemic,  which necessitated consideration that the patient might be at risk for infection with the SARS-CoV-2 virus that causes COVID-19. Institutional protocols and algorithms that pertain to the evaluation of patients at risk for COVID-19 are in a state of rapid change based on information released by regulatory bodies including the CDC and federal and state organizations. These policies and algorithms were followed during the patient's care in the ED.  ____________________________________________   FINAL CLINICAL IMPRESSION(S) / ED DIAGNOSES  Postsurgical pain   Minna Antis, MD 12/27/20 1400

## 2020-12-27 NOTE — ED Notes (Signed)
CT notified of IV placement per MD request

## 2020-12-27 NOTE — ED Triage Notes (Signed)
Pt to ED via POV, pt states that she has been having abdominal pain, nausea and vomiting. Pt states that she had her gallbladder out a month ago and has been having symptoms on and off since then. Pt states that the incision on the right lower abdomen is oozing green pus. Pt also reports what feels like a knot under the incision. Pt states that she had her gallbladder removed in [place]. Pt is here visiting and states that she called her surgeon and he suggested that she come in and be evaluated. Pt is in NAD.

## 2020-12-27 NOTE — ED Notes (Signed)
Pt taken to CT by CT

## 2020-12-27 NOTE — ED Notes (Signed)
Pt c/o lower back pain that started again suddenly. Pt states this is the first time the pain has radiated like this. MD aware, will medicate.

## 2022-03-04 ENCOUNTER — Encounter: Payer: Self-pay | Admitting: Physical Therapy

## 2022-03-04 ENCOUNTER — Ambulatory Visit: Payer: Medicaid Other | Attending: Orthopedic Surgery | Admitting: Physical Therapy

## 2022-03-04 DIAGNOSIS — R29898 Other symptoms and signs involving the musculoskeletal system: Secondary | ICD-10-CM | POA: Insufficient documentation

## 2022-03-04 DIAGNOSIS — M6281 Muscle weakness (generalized): Secondary | ICD-10-CM | POA: Diagnosis present

## 2022-03-04 DIAGNOSIS — M25562 Pain in left knee: Secondary | ICD-10-CM | POA: Diagnosis present

## 2022-03-04 NOTE — Therapy (Signed)
OUTPATIENT PHYSICAL THERAPY KNEE EVALUATION   Patient Name: Amy Salas MRN: [id number] DOB:1990/03/11, 32 y.o., female Today's Date: 03/04/2022   PT End of Session - 03/04/22 0852     Visit Number 1    Number of Visits 12    Date for PT Re-Evaluation 04/15/22    Authorization Type IE 03/04/2022    PT Start Time 0900    PT Stop Time 0940    PT Time Calculation (min) 40 min    Activity Tolerance Patient tolerated treatment well    Behavior During Therapy WFL for tasks assessed/performed             Past Medical History:  Diagnosis Date   POTS (postural orthostatic tachycardia syndrome)    PTSD (post-traumatic stress disorder)    Seizure ([place])    Past Surgical History:  Procedure Laterality Date   CHOLECYSTECTOMY     INGUINAL HERNIA REPAIR Bilateral    LAPAROSCOPIC APPENDECTOMY N/A 10/23/2020   Procedure: APPENDECTOMY LAPAROSCOPIC;  Surgeon: Jules Husbands, MD;  Location: [facility] ORS;  Service: General;  Laterality: N/A;   Patient Active Problem List   Diagnosis Date Noted   Acute appendicitis 10/23/2020    PCP: Pcp, No  REFERRING PROVIDER: Margreta Journey, MD  REFERRING DIAGNOSIS: M25.562 (ICD-10-CM) - Pain in left knee   THERAPY DIAG: Left knee pain, unspecified chronicity  Muscle weakness (generalized)  Poor body mechanics  RATIONALE FOR EVALUATION AND TREATMENT: Rehabilitation  ONSET DATE: 2013; worsened with 2020 neurological presentation  FOLLOW UP APPT WITH PROVIDER: Yes    SUBJECTIVE:                                                                                                                                                                                         Chief Complaint: Patient notes that L knee pain is longstanding, but was worsened in July 2020 when 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 Patient did PT for 6 months after 2020 incident to retrain gait. Patient notes that she is unable to straighten L leg completely. Patient  notes that while she is willing to try physical therapy to see if it can help her L knee, she does not believe that it will have a significant impact and believes she will still need to participate in exploratory surgery/scope.   Pertinent History Per MD note: "The patient is an 32 y.o. female seen in clinic today as a result of a referral by Dr. Lamar Sprinkles for left knee pain. When the joint is painful, she describes the pain as moderate (patient is active but has had to make modifications or give up activities). Symptoms  have been present for 10years and began insidiously . The pain is described as aching. Pain is localized to the anterior knee. Currently pain is aggravated with normal daily activities. She has popping . She has tried rest, NSAIDs . She has tried an intra-articular corticosteroid knee injection no relief. She has tried rest . Pain is relieved by rest."  Pain:  Pain Intensity: Present: 7-8/10, Best: 5/10, Worst: 20/10 Pain location: L knee diffuse anterior knee pain Pain quality: constant, sharp, aching, and throbbing   Radiating pain: Yes  Swelling: Yes (patient reported; none visibly apparent in clinic) Popping, catching, locking: Yes  Numbness/Tingling: Yes (below knee) Focal weakness or buckling: Yes; weekly Aggravating factors: squatting, "everything" Relieving factors: heat, elevation, soaking 24-hour pain behavior: evening (worsened with activity) How long can you sit: < 10 min How long can you stand: < 10 min History of prior back, hip, or knee injury, pain, surgery, or therapy: No Falls: Has patient fallen in last 6 months? No, Number of falls: 0 Follow-up appointment with MD: Yes Dominant hand: right Imaging: Yes  "Results for orders placed or performed in visit on 12/18/21  X-ray knee left 3 views  Narrative  3 views of the knee independently reviewed by myself today: AP: normal alignment with maintained tibiofemoral joint space Lat: appropriate patellar height  and patellofemoral joint space Merchant: normal patellar alignment"  MRI IMPRESSION:  1. Small nonspecific knee joint effusion. 2. Negative for acute fracture or dislocation.  Electronically Signed by: Gracy Bruins, MD, [facility] Electronically Signed on: 01/14/2022 5:00 PM"   Prior level of function: Independent Occupational demands: part time desk job, part time server Hobbies: being outdoors with family; Psychologist, sport and exercise; travel Red flags (bowel/bladder changes, saddle paresthesia, personal history of cancer, h/o spinal tumors, h/o compression fx, h/o abdominal aneurysm, abdominal pain, chills/fever, night sweats, nausea, vomiting, unrelenting pain): Negative  Precautions: None  Weight Bearing Restrictions: No   Patient Goals: "I would love to be less pain, some relief." "I am doing this because the doctor wants me to before surgery."   OBJECTIVE:   Patient Surveys  FOTO 35; predicted 36 in 12 visits   Cognition Patient is oriented to person, place, and time.  Recent memory is intact.  Remote memory is intact.  Attention span and concentration are intact.  Expressive speech is intact.  Patient's fund of knowledge is within normal limits for educational level.     Gross Musculoskeletal Assessment Tremor: None Bulk: Normal Tone: Normal No visible step-off along spinal column, no signs of scoliosis   GAIT:  Assistive device utilized: None Level of assistance: Complete Independence Comments: L Trendelenburg, no noted asymmetries in knee flexion/extension with gait in clinic.   Posture: Increased posterior pelvic tilt, increased thoracic kyphosis.   AROM  AROM (Normal range in degrees) AROM  03/04/2022  Lumbar   Flexion (65) WNL  Extension (30) WNL, L>R  Right lateral flexion (25) WNL  Left lateral flexion (25) WNL  Right rotation (30) WNL  Left rotation (30) WNL      Hip Right Left  Flexion (125)    Extension (15)    Abduction (40)    Adduction      Internal Rotation (45)    External Rotation (45)        Knee    Flexion (135) WFL WFL*  Extension (0) WNL (assessed during gait) WNL (assessed during gait)      Ankle    Dorsiflexion (20) WNL WNL  Plantarflexion (50) WNL  WNL  Inversion (35) WNL WNL  Eversion (15) WNL WNL  (* = pain; Blank rows = not tested)   LE MMT:  MMT (out of 5) Right 03/04/2022 Left 03/04/2022  Hip flexion 3+ (trunk) 3*  Hip extension    Hip abduction (seated) 4 4  Hip adduction (seated) 4 4  Hip internal rotation 4 3*  Hip external rotation 4 3*   Knee flexion 4 3+* (5/10 NPRS)  Knee extension 4 3+* (5/10 NPRS)  Ankle dorsiflexion    Ankle plantarflexion    Ankle inversion    Ankle eversion    (* = pain; Blank rows = not tested)   Sensation Grossly intact to light touch bilateral LEs as determined by testing dermatomes L2-S2. Proprioception, and hot/cold testing deferred on this date.   Reflexes R/L Knee Jerk (L3/4): 2+/2+  Ankle Jerk (S1/2): 2+/2+    Palpation  Location LEFT  RIGHT           Quadriceps 2 0  Medial Hamstrings 1 0  Lateral Hamstrings 2 0  Lateral Hamstring tendon 2 0  Medial Hamstring tendon 1 0  Quadriceps tendon 2 0  Patella 0 0  Patellar Tendon 0 0  Tibial Tuberosity 0 0  Medial joint line 0 0  Lateral joint line 0 0  MCL 0 0  LCL 0 0  Adductor Tubercle    Pes Anserine tendon 0 0  Infrapatellar fat pad 0 0  Fibular head 2 0  Popliteal fossa 1 0  (Blank rows = not tested) Graded on 0-4 scale (0 = no pain, 1 = pain, 2 = pain with wincing/grimacing/flinching, 3 = pain with withdrawal, 4 = unwilling to allow palpation), (Blank rows = not tested)   Passive Accessory Motion Tibiofemoral: Distraction: R: Negative L: Negative Anterior Glide: R: Negative L: Negative Posterior Glide: R: Negative L: Negative Medial Glide: R: Negative L: Negative Lateral Glide: R: Negative L: Negative  Fibula on Femur: Anterior: R: Negative L: Negative Posterior: R: Negative  L: Negative  Patellofemoral: Superior Glide: R: Negative L: Negative Inferior Glide: R: Negative L: Negative Medial Glide: R: Negative L: Negative Lateral Glide: R: Negative L: Negative  Medial Tilt: R: Negative L: Negative Lateral Tilt: R: Negative L: Negative   VASCULAR Dorsalis pedis and posterior tibial pulses are palpable   SPECIAL TESTS  Ligamentous Stability  ACL: Anterior Drawer: R: Negative L: Negative   PCL: Posterior Drawer: R: Negative L: Negative  Meniscus Tests Thessaly: R: Negative L: Negative Effusion: R: Negative L: Negative    Patellofemoral Pain Syndrome Patellar Tilt (Lateral): R: Negative L: Negative Squatting pain: R: Negative L: Positive Kneeling pain: R: Negative L: Positive Resisted knee extension pain: R: Negative L: Positive   Patellar Tendinopathy Inferior pole palpation with anterior tilt: R: Negative L: Negative  PATIENT EDUCATION:  Education details: Plan of care Person educated: Patient Education method: Explanation Education comprehension: verbalized understanding   HOME EXERCISE PROGRAM: None currently   ASSESSMENT:  CLINICAL IMPRESSION: Patient is a 32 y.o. who was seen today for physical therapy evaluation and treatment for left sided knee pain. Today's evaluation is suggestive of deficits in nervous system downregulation, L hip strength, L knee strength, and pain as evidenced by 20/10 worst pain, poor tolerance to manual assessment of the joint in no distinct symptom/sign pattern, B hip flexion < 4/5 MMT with significant postural compensations, LLE MMT grossly 3+/5 and painful (although pain reported is 2-3 point reduction from pain assessment at start of evaluation).  Patient's gait mechanics suggest normal/functional available ROM at B knees with L sided hip strength deficits that may impair L knee mechanics with high repetition tasks (standing, walking, hiking). Objective impairments include Abnormal gait, decreased activity  tolerance, decreased coordination, decreased endurance, difficulty walking, decreased strength, improper body mechanics, and pain. These impairments are limiting patient from meal prep, cleaning, laundry, shopping, community activity, occupation, and yard work. Personal factors including Behavior pattern, Past/current experiences, Time since onset of injury/illness/exacerbation, and 3+ comorbidities: Anxiety, depression, POTS, seizure, hx of B inguinal hernia,   are also affecting patient's functional outcome. Patient's score on FOTO measure indicates significant impairment (35).Patient will benefit from skilled PT to address above impairments and improve overall function.  REHAB POTENTIAL: Poor (reports low buy-in)  CLINICAL DECISION MAKING: Evolving/moderate complexity  EVALUATION COMPLEXITY: Moderate   GOALS: Goals reviewed with patient? Yes  SHORT TERM GOALS: Target date: 03/25/2022  Pt will be independent with HEP to improve strength and decrease knee pain to improve pain-free function at home and work. Baseline: [id number] Goal status: INITIAL   LONG TERM GOALS: Target date: 04/15/2022  Pt will increase FOTO to at least 57 to demonstrate significant improvement in function at home and work related to knee pain  Baseline: 35, risk adjusted 56 Goal status: INITIAL  2.  Pt will decrease worst knee pain by at least 3 points on the NPRS in order to demonstrate clinically significant reduction in back pain. Baseline: 20/10 Goal status: INITIAL  3.  Patient will report being able to return to activities including, but not limited to: standing > 10 min with pain controlled or minimal limitation to indicate resolution of the chief complaint and return to prior level of participation at home and in the community.   Baseline: unable Goal status: INITIAL  4.  Pt will increase strength of LLE by at least 1/2 MMT grade in order to demonstrate improvement in strength and function  Baseline:  3+/5 Goal status: INITIAL   PLAN: PT FREQUENCY: 1-2x/week  PT DURATION: 6 weeks  PLANNED INTERVENTIONS: Therapeutic exercises, Therapeutic activity, Neuromuscular re-education, Balance training, Gait training, Patient/Family education, Joint manipulation, Joint mobilization, Canalith repositioning, Aquatic Therapy, Dry Needling, Electrical stimulation, Spinal manipulation, Spinal mobilization, Cryotherapy, Moist heat, Taping, Traction, Ultrasound, Ionotophoresis 4mg /ml Dexamethasone, and Manual therapy  PLAN FOR NEXT SESSION: hip and core strengthening   PT, DPT [phone]  03/04/2022, 8:52 AM

## 2022-03-06 ENCOUNTER — Ambulatory Visit: Payer: Medicaid Other | Admitting: Physical Therapy

## 2022-03-10 ENCOUNTER — Encounter: Payer: Medicaid - Out of State | Admitting: Physical Therapy

## 2022-03-11 ENCOUNTER — Encounter: Payer: Self-pay | Admitting: Physical Therapy

## 2022-03-11 ENCOUNTER — Ambulatory Visit: Payer: Medicaid Other | Admitting: Physical Therapy

## 2022-03-12 ENCOUNTER — Encounter: Payer: Medicaid - Out of State | Admitting: Physical Therapy

## 2022-03-13 ENCOUNTER — Ambulatory Visit: Payer: Medicaid Other | Admitting: Physical Therapy

## 2022-03-17 ENCOUNTER — Encounter: Payer: Medicaid - Out of State | Admitting: Physical Therapy

## 2022-03-18 ENCOUNTER — Encounter: Payer: Medicaid - Out of State | Admitting: Physical Therapy

## 2022-03-19 ENCOUNTER — Encounter: Payer: Medicaid - Out of State | Admitting: Physical Therapy

## 2022-03-20 ENCOUNTER — Encounter: Payer: Medicaid - Out of State | Admitting: Physical Therapy

## 2022-03-24 ENCOUNTER — Encounter: Payer: Medicaid - Out of State | Admitting: Physical Therapy

## 2022-03-25 ENCOUNTER — Encounter: Payer: Medicaid - Out of State | Admitting: Physical Therapy

## 2022-03-26 ENCOUNTER — Encounter: Payer: Medicaid - Out of State | Admitting: Physical Therapy

## 2022-03-27 ENCOUNTER — Encounter: Payer: Medicaid - Out of State | Admitting: Physical Therapy

## 2022-03-31 ENCOUNTER — Encounter: Payer: Medicaid - Out of State | Admitting: Physical Therapy

## 2022-04-01 ENCOUNTER — Encounter: Payer: Medicaid - Out of State | Admitting: Physical Therapy

## 2022-04-02 ENCOUNTER — Encounter: Payer: Medicaid - Out of State | Admitting: Physical Therapy

## 2022-04-03 ENCOUNTER — Encounter: Payer: Medicaid - Out of State | Admitting: Physical Therapy

## 2022-10-23 ENCOUNTER — Emergency Department: Payer: Medicaid Other

## 2022-10-23 ENCOUNTER — Other Ambulatory Visit: Payer: Self-pay

## 2022-10-23 ENCOUNTER — Encounter: Payer: Self-pay | Admitting: Emergency Medicine

## 2022-10-23 ENCOUNTER — Emergency Department
Admission: EM | Admit: 2022-10-23 | Discharge: 2022-10-23 | Disposition: A | Discharge status: Home / Self Care | Payer: Medicaid Other | Attending: Emergency Medicine | Admitting: Emergency Medicine

## 2022-10-23 DIAGNOSIS — N760 Acute vaginitis: Secondary | ICD-10-CM | POA: Diagnosis not present

## 2022-10-23 DIAGNOSIS — B9689 Other specified bacterial agents as the cause of diseases classified elsewhere: Secondary | ICD-10-CM | POA: Diagnosis not present

## 2022-10-23 DIAGNOSIS — R1032 Left lower quadrant pain: Secondary | ICD-10-CM | POA: Diagnosis present

## 2022-10-23 DIAGNOSIS — N83202 Unspecified ovarian cyst, left side: Secondary | ICD-10-CM | POA: Insufficient documentation

## 2022-10-23 LAB — COMPREHENSIVE METABOLIC PANEL
ALT: 12 U/L (ref 0–44)
AST: 13 U/L — ABNORMAL LOW (ref 15–41)
Albumin: 4.3 g/dL (ref 3.5–5.0)
Alkaline Phosphatase: 42 U/L (ref 38–126)
Anion gap: 6 (ref 5–15)
BUN: 13 mg/dL (ref 6–20)
CO2: 25 mmol/L (ref 22–32)
Calcium: 8.8 mg/dL — ABNORMAL LOW (ref 8.9–10.3)
Chloride: 106 mmol/L (ref 98–111)
Creatinine, Ser: 0.72 mg/dL (ref 0.44–1.00)
GFR, Estimated: >60 mL/min (ref >60)
Glucose, Bld: 95 mg/dL (ref 70–99)
Potassium: 3.8 mmol/L (ref 3.5–5.1)
Sodium: 137 mmol/L (ref 135–145)
Total Bilirubin: 0.5 mg/dL (ref 0.3–1.2)
Total Protein: 6.9 g/dL (ref 6.5–8.1)

## 2022-10-23 LAB — CBC
HCT: 34 % — ABNORMAL LOW (ref 36.0–46.0)
Hemoglobin: 11.5 g/dL — ABNORMAL LOW (ref 12.0–15.0)
MCH: 30.3 pg (ref 26.0–34.0)
MCHC: 33.8 g/dL (ref 30.0–36.0)
MCV: 89.7 fL (ref 80.0–100.0)
Platelets: 190 10*3/uL (ref 150–400)
RBC: 3.79 MIL/uL — ABNORMAL LOW (ref 3.87–5.11)
RDW: 14 % (ref 11.5–15.5)
WBC: 4 10*3/uL (ref 4.0–10.5)
nRBC: 0 % (ref 0.0–0.2)

## 2022-10-23 LAB — WET PREP, GENITAL
Sperm: NONE SEEN
Trich, Wet Prep: NONE SEEN
WBC, Wet Prep HPF POC: <10 (ref <10)
Yeast Wet Prep HPF POC: NONE SEEN

## 2022-10-23 LAB — URINALYSIS, ROUTINE W REFLEX MICROSCOPIC
Bilirubin Urine: NEGATIVE
Glucose, UA: NEGATIVE mg/dL
Hgb urine dipstick: NEGATIVE
Ketones, ur: NEGATIVE mg/dL
Leukocytes,Ua: NEGATIVE
Nitrite: NEGATIVE
Protein, ur: NEGATIVE mg/dL
Specific Gravity, Urine: 1.005 (ref 1.005–1.030)
pH: 7 (ref 5.0–8.0)

## 2022-10-23 LAB — LIPASE, BLOOD: Lipase: 28 U/L (ref 11–51)

## 2022-10-23 LAB — CHLAMYDIA/NGC RT PCR (ARMC ONLY)
Chlamydia Tr: NOT DETECTED
N gonorrhoeae: NOT DETECTED

## 2022-10-23 LAB — POC URINE PREG, ED: Preg Test, Ur: NEGATIVE

## 2022-10-23 MED ORDER — KETOROLAC TROMETHAMINE 15 MG/ML IJ SOLN
15.0000 mg | Freq: Once | INTRAMUSCULAR | Status: AC
  Administered 2022-10-23: 15 mg via INTRAVENOUS
  Filled 2022-10-23: qty 1

## 2022-10-23 MED ORDER — MORPHINE SULFATE (PF) 4 MG/ML IV SOLN
4.0000 mg | Freq: Once | INTRAVENOUS | Status: AC
  Administered 2022-10-23: 4 mg via INTRAVENOUS
  Filled 2022-10-23: qty 1

## 2022-10-23 MED ORDER — FENTANYL CITRATE PF 50 MCG/ML IJ SOSY
50.0000 ug | PREFILLED_SYRINGE | Freq: Once | INTRAMUSCULAR | Status: AC
  Administered 2022-10-23: 50 ug via INTRAVENOUS
  Filled 2022-10-23: qty 1

## 2022-10-23 MED ORDER — LACTATED RINGERS IV BOLUS
1000.0000 mL | Freq: Once | INTRAVENOUS | Status: AC
  Administered 2022-10-23: 1000 mL via INTRAVENOUS

## 2022-10-23 MED ORDER — ONDANSETRON HCL 4 MG/2ML IJ SOLN
4.0000 mg | Freq: Once | INTRAMUSCULAR | Status: AC
  Administered 2022-10-23: 4 mg via INTRAVENOUS
  Filled 2022-10-23: qty 2

## 2022-10-23 MED ORDER — METRONIDAZOLE 500 MG PO TABS: 500.0000 mg | ORAL_TABLET | Freq: Two times a day (BID) | ORAL | 0 refills | Status: DC

## 2022-10-23 NOTE — ED Provider Notes (Signed)
[facility] Provider Note    Event Date/Time   First MD Initiated Contact with Patient 10/23/22 0800     (approximate)   History   Abdominal Pain   HPI Amy Salas is a 32 y.o. female with prior right-sided ectopic pregnancy s/p salpingectomy in 2023 who presents today for left lower quadrant abdominal pain.  Patient states that she started noticing left lower groin pain several days ago which was waxing and waning.  It worsened last night and into this morning where she felt a knot on the left side of her groin.  She has had associated nausea without vomiting.  Denies fevers or chills.  Denies vaginal bleeding but does have whitish vaginal discharge.  Notes odorous urine but no dysuria.  Questionable hematuria.  No abdominal pain elsewhere.  Patient is sexually active and not on any other form of birth control at this time.     Physical Exam   Triage Vital Signs: ED Triage Vitals  Encounter Vitals Group     BP 10/23/22 0755 (!) 89/69     Systolic BP Percentile --      Diastolic BP Percentile --      Pulse Rate 10/23/22 0755 98     Resp 10/23/22 0755 18     Temp 10/23/22 0755 98.5 F (36.9 C)     Temp Source 10/23/22 0755 Oral     SpO2 10/23/22 0755 96 %     Weight 10/23/22 0756 122 lb (55.3 kg)     Height 10/23/22 0756 5\' 9"  (1.753 m)     Head Circumference --      Peak Flow --      Pain Score 10/23/22 0756 8     Pain Loc --      Pain Education --      Exclude from Growth Chart --     Most recent vital signs: Vitals:   10/23/22 1100 10/23/22 1200  BP: 102/73 (!) 89/55  Pulse: (!) 107 (!) 46  Resp:  18  Temp:    SpO2: 100% 98%   Physical Exam: I have reviewed the vital signs and nursing notes. General: Awake, alert, no acute distress.  Nontoxic appearing. Tearful Head:  Atraumatic, normocephalic.   ENT:  EOM intact, PERRL. Oral mucosa is pink and moist with no lesions. Neck: Neck is supple with full range of motion, No  meningeal signs. Cardiovascular:  RRR, No murmurs. Peripheral pulses palpable and equal bilaterally. Respiratory:  Symmetrical chest wall expansion.  No rhonchi, rales, or wheezes.  Good air movement throughout.  No use of accessory muscles.   Musculoskeletal:  No cyanosis or edema. Moving extremities with full ROM Abdomen:  Soft, tenderness to palpation in left lower quadrant, no palpable knot, nondistended. Neuro:  GCS 15, moving all four extremities, interacting appropriately. Speech clear. Psych:  Calm, appropriate.   Skin:  Warm, dry, no rash.     ED Results / Procedures / Treatments   Labs (all labs ordered are listed, but only abnormal results are displayed) Labs Reviewed  WET PREP, GENITAL - Abnormal; Notable for the following components:      Result Value   Clue Cells Wet Prep HPF POC PRESENT (*)    All other components within normal limits  COMPREHENSIVE METABOLIC PANEL - Abnormal; Notable for the following components:   Calcium 8.8 (*)    AST 13 (*)    All other components within normal limits  CBC - Abnormal; Notable for  the following components:   RBC 3.79 (*)    Hemoglobin 11.5 (*)    HCT 34.0 (*)    All other components within normal limits  URINALYSIS, ROUTINE W REFLEX MICROSCOPIC - Abnormal; Notable for the following components:   Color, Urine STRAW (*)    APPearance CLEAR (*)    All other components within normal limits  CHLAMYDIA/NGC RT PCR ([facility] ONLY)            LIPASE, BLOOD  POC URINE PREG, ED     EKG    RADIOLOGY My independent interpretation of ultrasound shows large cyst on the left ovary but no other evidence of torsion.   PROCEDURES:  Critical Care performed: No  Procedures   MEDICATIONS ORDERED IN ED: Medications  lactated ringers bolus 1,000 mL (0 mLs Intravenous Stopped 10/23/22 1023)  ondansetron (ZOFRAN) injection 4 mg (4 mg Intravenous Given 10/23/22 0828)  morphine (PF) 4 MG/ML injection 4 mg (4 mg Intravenous Given 10/23/22  0829)  ketorolac (TORADOL) 15 MG/ML injection 15 mg (15 mg Intravenous Given 10/23/22 1024)  lactated ringers bolus 1,000 mL (1,000 mLs Intravenous New Bag/Given 10/23/22 1033)  fentaNYL (SUBLIMAZE) injection 50 mcg (50 mcg Intravenous Given 10/23/22 1106)     IMPRESSION / MDM / ASSESSMENT AND PLAN / ED COURSE  I reviewed the triage vital signs and the nursing notes.                              Differential diagnosis includes, but is not limited to, ovarian torsion, ovarian cyst rupture, pelvic inflammatory disease, STI, UTI.  Patient's presentation is most consistent with acute presentation with potential threat to life or bodily function.  Patient is a 32 year old female presenting today for left-sided groin pain.  Ultrasound shows evidence of a left-sided hemorrhagic ovarian cyst but without more concerning features.  No evidence for ovarian torsion.  Patient was treated symptomatically with improvement.  Laboratory workup otherwise stable at this time.  Pelvic exam was also performed to rule out evidence of pelvic inflammatory disease.  Patient was negative for gonorrhea and chlamydia.  She was positive for bacterial vaginosis and started on Flagyl.  Patient has POTS at baseline reports her blood pressure at home sits at 90/60 per her baseline.  Blood pressure was in the same range while here in the emergency department and she was given 2 L of fluid.  Otherwise, no lightheadedness.  Patient was given strict return precautions for any worsening symptoms and will follow-up with gynecology outpatient.  The patient is on the cardiac monitor to evaluate for evidence of arrhythmia and/or significant heart rate changes. Clinical Course as of 10/23/22 1348  Thu Oct 23, 2022  0843 CBC(!) unremarkable [DW]  0845 Preg Test, Ur: NEGATIVE [DW]  0855 Comprehensive metabolic panel(!) unremarkable [DW]  0855 Urinalysis, Routine w reflex microscopic -Urine, Clean Catch(!) No UTI [DW]  1021 Reassessed  patient.  Ongoing pain symptoms following ultrasound.  Given Toradol at this time and will await results of pelvic ultrasound.  If this is negative, will perform vaginal examination to rule out pelvic inflammatory disease [DW]  1030 Patient reports history of POTS and that her blood pressure normally sits with BP right around the 90/60.  Is slightly low at this time to 89/51.  Will give a second liter of fluids. No lightheadedness [DW]  1044 US PELVIC COMPLETE W TRANSVAGINAL AND TORSION R/O IMPRESSION: 1. Heterogeneously echogenic structure in the left  ovary measuring up to 4.3 cm without appreciable central vascularity, which may represent a hemorrhagic cyst or endometrioma. Consider repeat ultrasound examination in 6 weeks to ensure resolution. 2. No current finding of ovarian torsion. Recommend repeat ultrasound examination with Doppler if there is acute worsening of symptoms as the asymmetrically enlarged size of the left ovary may act as a lead point for torsion.   [DW]  1148 Whitish discharge from the cervix on pelvic exam.  No cervical motion tenderness.  [DW]  1208 Clue Cells Wet Prep HPF POC(!): PRESENT [DW]  1345 Reassessed patient.  No pain symptoms at this time.  Will treat for bacterial vaginosis and discharge with gynecology follow-up for hemorrhagic cyst. [DW]    Clinical Course User Index [DW] Janith Lima, MD     FINAL CLINICAL IMPRESSION(S) / ED DIAGNOSES   Final diagnoses:  Hemorrhagic cyst of left ovary  Bacterial vaginosis     Rx / DC Orders   ED Discharge Orders          Ordered    metroNIDAZOLE (FLAGYL) 500 MG tablet  2 times daily        10/23/22 1347    Ambulatory referral to Gynecology       Comments: Left ovary hemorrhagic cyst   10/23/22 1347             Note:  This document was prepared using Dragon voice recognition software and may include unintentional dictation errors.   Janith Lima, MD 10/23/22 1350

## 2022-10-23 NOTE — Discharge Instructions (Signed)
You were seen in the emergency department today for your abdominal pain.  Abdominal pain.  You are found to have a hemorrhagic cyst on your left ovary but your blood work is otherwise reassuring.  No evidence of ovarian torsion.  Please follow-up with gynecology within the next week for reassessment.  I have also sent antibiotics to your pharmacy to take as you are positive for bacterial vaginosis today.  Please return if you notice acute worsening symptoms of abdominal pain, vomiting, or fevers.

## 2022-10-23 NOTE — ED Notes (Signed)
Pt to ultrasound. Son (69yrs)  went with pt.

## 2022-10-23 NOTE — ED Triage Notes (Signed)
Patient to ED via POV for LLQ abd pain. Pt states she feels a golf ball sized knot in abd. Having nausea and pain "for a few days." Taking zofran at home- last took yesterday PM.

## 2022-10-25 ENCOUNTER — Emergency Department: Payer: Medicaid Other

## 2022-10-25 ENCOUNTER — Other Ambulatory Visit: Payer: Self-pay

## 2022-10-25 ENCOUNTER — Emergency Department
Admission: EM | Admit: 2022-10-25 | Discharge: 2022-10-25 | Disposition: A | Discharge status: Home / Self Care | Payer: Medicaid Other | Attending: Emergency Medicine | Admitting: Emergency Medicine

## 2022-10-25 DIAGNOSIS — N83202 Unspecified ovarian cyst, left side: Secondary | ICD-10-CM

## 2022-10-25 DIAGNOSIS — R1032 Left lower quadrant pain: Secondary | ICD-10-CM | POA: Diagnosis present

## 2022-10-25 HISTORY — DX: Unspecified ovarian cyst, unspecified side: N83.209

## 2022-10-25 LAB — URINALYSIS, ROUTINE W REFLEX MICROSCOPIC
Bilirubin Urine: NEGATIVE
Glucose, UA: NEGATIVE mg/dL
Hgb urine dipstick: NEGATIVE
Ketones, ur: NEGATIVE mg/dL
Leukocytes,Ua: NEGATIVE
Nitrite: POSITIVE — AB
Protein, ur: NEGATIVE mg/dL
Specific Gravity, Urine: 1.014 (ref 1.005–1.030)
pH: 7 (ref 5.0–8.0)

## 2022-10-25 LAB — CBC
HCT: 38.6 % (ref 36.0–46.0)
Hemoglobin: 12.2 g/dL (ref 12.0–15.0)
MCH: 29.9 pg (ref 26.0–34.0)
MCHC: 31.6 g/dL (ref 30.0–36.0)
MCV: 94.6 fL (ref 80.0–100.0)
Platelets: 187 10*3/uL (ref 150–400)
RBC: 4.08 MIL/uL (ref 3.87–5.11)
RDW: 14.1 % (ref 11.5–15.5)
WBC: 5.1 10*3/uL (ref 4.0–10.5)
nRBC: 0 % (ref 0.0–0.2)

## 2022-10-25 LAB — COMPREHENSIVE METABOLIC PANEL
ALT: 12 U/L (ref 0–44)
AST: 15 U/L (ref 15–41)
Albumin: 4.1 g/dL (ref 3.5–5.0)
Alkaline Phosphatase: 37 U/L — ABNORMAL LOW (ref 38–126)
Anion gap: 8 (ref 5–15)
BUN: 15 mg/dL (ref 6–20)
CO2: 21 mmol/L — ABNORMAL LOW (ref 22–32)
Calcium: 8.9 mg/dL (ref 8.9–10.3)
Chloride: 108 mmol/L (ref 98–111)
Creatinine, Ser: 0.73 mg/dL (ref 0.44–1.00)
GFR, Estimated: >60 mL/min (ref >60)
Glucose, Bld: 93 mg/dL (ref 70–99)
Potassium: 4 mmol/L (ref 3.5–5.1)
Sodium: 137 mmol/L (ref 135–145)
Total Bilirubin: 0.7 mg/dL (ref 0.3–1.2)
Total Protein: 7 g/dL (ref 6.5–8.1)

## 2022-10-25 MED ORDER — KETOROLAC TROMETHAMINE 30 MG/ML IJ SOLN
30.0000 mg | Freq: Once | INTRAMUSCULAR | Status: AC
  Administered 2022-10-25: 30 mg via INTRAMUSCULAR
  Filled 2022-10-25: qty 1

## 2022-10-25 MED ORDER — OXYCODONE-ACETAMINOPHEN 5-325 MG PO TABS
1.0000 | ORAL_TABLET | Freq: Three times a day (TID) | ORAL | 0 refills | Status: DC | PRN
Start: 2022-10-25 — End: 2022-10-25

## 2022-10-25 MED ORDER — OXYCODONE-ACETAMINOPHEN 5-325 MG PO TABS
1.0000 | ORAL_TABLET | Freq: Once | ORAL | Status: AC
  Administered 2022-10-25: 1 via ORAL
  Filled 2022-10-25: qty 1

## 2022-10-25 MED ORDER — OXYCODONE-ACETAMINOPHEN 5-325 MG PO TABS: 1.0000 | ORAL_TABLET | Freq: Three times a day (TID) | ORAL | 0 refills | Status: DC | PRN

## 2022-10-25 MED ORDER — MORPHINE SULFATE (PF) 4 MG/ML IV SOLN
4.0000 mg | Freq: Once | INTRAVENOUS | Status: AC
  Administered 2022-10-25: 4 mg via INTRAVENOUS
  Filled 2022-10-25: qty 1

## 2022-10-25 MED ORDER — DICYCLOMINE HCL 10 MG PO CAPS
10.0000 mg | ORAL_CAPSULE | Freq: Once | ORAL | Status: AC
  Administered 2022-10-25: 10 mg via ORAL
  Filled 2022-10-25: qty 1

## 2022-10-25 MED ORDER — ONDANSETRON HCL 4 MG/2ML IJ SOLN
4.0000 mg | Freq: Once | INTRAMUSCULAR | Status: AC
  Administered 2022-10-25: 4 mg via INTRAVENOUS
  Filled 2022-10-25: qty 2

## 2022-10-25 NOTE — ED Notes (Signed)
POCT Preg Negative

## 2022-10-25 NOTE — ED Provider Notes (Signed)
[facility] Provider Note    Event Date/Time   First MD Initiated Contact with Patient 10/25/22 0800     (approximate)   History   Abdominal Pain   HPI  Amy Salas is a 32 y.o. female who presents with left lower quadrant abdominal/pelvic pain.  She reports it has been tolerable over the last couple of days, she was seen in the emergency department 2 days ago and had reassuring workup however this morning it became severe and she felt lightheaded.  She does have a history of POTS.     Physical Exam   Triage Vital Signs: ED Triage Vitals  Encounter Vitals Group     BP 10/25/22 0803 114/75     Systolic BP Percentile --      Diastolic BP Percentile --      Pulse Rate 10/25/22 0803 72     Resp 10/25/22 0803 20     Temp 10/25/22 0803 98.6 F (37 C)     Temp Source 10/25/22 0803 Oral     SpO2 10/25/22 0803 95 %     Weight --      Height --      Head Circumference --      Peak Flow --      Pain Score 10/25/22 0800 10     Pain Loc --      Pain Education --      Exclude from Growth Chart --     Most recent vital signs: Vitals:   10/25/22 1230 10/25/22 1245  BP: (!) 93/58   Pulse: (!) 59 72  Resp: 14 20  Temp:    SpO2: 99% 100%     General: Awake, no distress.  CV:  Good peripheral perfusion.  Resp:  Normal effort.  Abd:  No distention.  Mild tenderness in the lower left quadrant Other:     ED Results / Procedures / Treatments   Labs (all labs ordered are listed, but only abnormal results are displayed) Labs Reviewed  COMPREHENSIVE METABOLIC PANEL - Abnormal; Notable for the following components:      Result Value   CO2 21 (*)    Alkaline Phosphatase 37 (*)    All other components within normal limits  URINALYSIS, ROUTINE W REFLEX MICROSCOPIC - Abnormal; Notable for the following components:   Color, Urine YELLOW (*)    APPearance HAZY (*)    Nitrite POSITIVE (*)    Bacteria, UA RARE (*)    All other  components within normal limits  CBC     EKG     RADIOLOGY    PROCEDURES:  Critical Care performed:   Procedures   MEDICATIONS ORDERED IN ED: Medications  morphine (PF) 4 MG/ML injection 4 mg (4 mg Intravenous Given 10/25/22 0831)  ondansetron (ZOFRAN) injection 4 mg (4 mg Intravenous Given 10/25/22 0830)  morphine (PF) 4 MG/ML injection 4 mg (4 mg Intravenous Given 10/25/22 1041)  ketorolac (TORADOL) 30 MG/ML injection 30 mg (30 mg Intramuscular Given 10/25/22 1105)  dicyclomine (BENTYL) capsule 10 mg (10 mg Oral Given 10/25/22 1143)  oxyCODONE-acetaminophen (PERCOCET/ROXICET) 5-325 MG per tablet 1 tablet (1 tablet Oral Given 10/25/22 1306)     IMPRESSION / MDM / ASSESSMENT AND PLAN / ED COURSE  I reviewed the triage vital signs and the nursing notes. Patient's presentation is most consistent with acute presentation with potential threat to life or bodily function.  Patient presents with left lower quadrant abdominal pain, differential includes ovarian  cyst rupture, torsion, ureterolithiasis, UTI  Patient has known 4 cm ovarian cyst diagnosed on ultrasound 2 days ago, reassuring workup at that time  Given worsening pain, concern for possible intermittent torsion, will send for ultrasound if unremarkable will proceed with CT scan, treated with IV morphine, IV Zofran  Ultrasound is reassuring, patient is feeling improved after treatment.  Lab work unremarkable.  Consistent with ruptured cyst most likely no evidence of torsion  No dysuria to suggest UTI  Offered admission however the patient would prefer to be discharged with analgesics she knows she can return anytime, recommend follow-up with GYN for repeat ultrasound in 3 to 6 weeks      FINAL CLINICAL IMPRESSION(S) / ED DIAGNOSES   Final diagnoses:  Cyst of left ovary     Rx / DC Orders   ED Discharge Orders          Ordered    oxyCODONE-acetaminophen (PERCOCET) 5-325 MG tablet  Every 8 hours PRN,    Status:  Discontinued        10/25/22 1303    oxyCODONE-acetaminophen (PERCOCET) 5-325 MG tablet  Every 8 hours PRN        10/25/22 1308             Note:  This document was prepared using Dragon voice recognition software and may include unintentional dictation errors.   Jene Every, MD 10/25/22 [phone]

## 2022-10-25 NOTE — ED Triage Notes (Signed)
[facility] reports pt coming from home c/o abdominal pain. Pt recently diagnosed with ovarian cyst this week. Pt having nausea.

## 2022-10-30 ENCOUNTER — Other Ambulatory Visit: Payer: Self-pay

## 2022-10-30 ENCOUNTER — Emergency Department: Payer: Medicaid Other | Admitting: Anesthesiology

## 2022-10-30 ENCOUNTER — Inpatient Hospital Stay
Admission: EM | Admit: 2022-10-30 | Discharge: 2022-11-05 | DRG: 742 | Disposition: A | Discharge status: Home / Self Care | Payer: Medicaid Other | Attending: Obstetrics and Gynecology | Admitting: Obstetrics and Gynecology

## 2022-10-30 ENCOUNTER — Encounter
Admission: EM | Disposition: A | Discharge status: Home / Self Care | Payer: Self-pay | Source: Home / Self Care | Attending: Obstetrics and Gynecology

## 2022-10-30 ENCOUNTER — Emergency Department: Payer: Medicaid Other

## 2022-10-30 DIAGNOSIS — Z9079 Acquired absence of other genital organ(s): Secondary | ICD-10-CM

## 2022-10-30 DIAGNOSIS — K2950 Unspecified chronic gastritis without bleeding: Secondary | ICD-10-CM | POA: Diagnosis present

## 2022-10-30 DIAGNOSIS — G90A Postural orthostatic tachycardia syndrome (POTS): Secondary | ICD-10-CM | POA: Diagnosis present

## 2022-10-30 DIAGNOSIS — N838 Other noninflammatory disorders of ovary, fallopian tube and broad ligament: Secondary | ICD-10-CM | POA: Diagnosis present

## 2022-10-30 DIAGNOSIS — R109 Unspecified abdominal pain: Secondary | ICD-10-CM | POA: Insufficient documentation

## 2022-10-30 DIAGNOSIS — K219 Gastro-esophageal reflux disease without esophagitis: Secondary | ICD-10-CM | POA: Diagnosis present

## 2022-10-30 DIAGNOSIS — N920 Excessive and frequent menstruation with regular cycle: Secondary | ICD-10-CM | POA: Diagnosis not present

## 2022-10-30 DIAGNOSIS — E611 Iron deficiency: Secondary | ICD-10-CM | POA: Insufficient documentation

## 2022-10-30 DIAGNOSIS — K661 Hemoperitoneum: Secondary | ICD-10-CM | POA: Diagnosis present

## 2022-10-30 DIAGNOSIS — R1032 Left lower quadrant pain: Secondary | ICD-10-CM

## 2022-10-30 DIAGNOSIS — R1310 Dysphagia, unspecified: Secondary | ICD-10-CM | POA: Diagnosis not present

## 2022-10-30 DIAGNOSIS — N390 Urinary tract infection, site not specified: Secondary | ICD-10-CM | POA: Diagnosis not present

## 2022-10-30 DIAGNOSIS — K449 Diaphragmatic hernia without obstruction or gangrene: Secondary | ICD-10-CM | POA: Diagnosis present

## 2022-10-30 DIAGNOSIS — K2970 Gastritis, unspecified, without bleeding: Secondary | ICD-10-CM

## 2022-10-30 DIAGNOSIS — Z8041 Family history of malignant neoplasm of ovary: Secondary | ICD-10-CM

## 2022-10-30 DIAGNOSIS — K5900 Constipation, unspecified: Secondary | ICD-10-CM | POA: Diagnosis not present

## 2022-10-30 DIAGNOSIS — D62 Acute posthemorrhagic anemia: Secondary | ICD-10-CM | POA: Diagnosis not present

## 2022-10-30 DIAGNOSIS — N83202 Unspecified ovarian cyst, left side: Principal | ICD-10-CM | POA: Diagnosis present

## 2022-10-30 DIAGNOSIS — Z882 Allergy status to sulfonamides status: Secondary | ICD-10-CM

## 2022-10-30 DIAGNOSIS — Z883 Allergy status to other anti-infective agents status: Secondary | ICD-10-CM

## 2022-10-30 DIAGNOSIS — F4310 Post-traumatic stress disorder, unspecified: Secondary | ICD-10-CM | POA: Diagnosis present

## 2022-10-30 DIAGNOSIS — G40909 Epilepsy, unspecified, not intractable, without status epilepticus: Secondary | ICD-10-CM | POA: Diagnosis present

## 2022-10-30 DIAGNOSIS — E538 Deficiency of other specified B group vitamins: Secondary | ICD-10-CM | POA: Diagnosis present

## 2022-10-30 DIAGNOSIS — D649 Anemia, unspecified: Secondary | ICD-10-CM | POA: Insufficient documentation

## 2022-10-30 DIAGNOSIS — K200 Eosinophilic esophagitis: Secondary | ICD-10-CM | POA: Diagnosis present

## 2022-10-30 DIAGNOSIS — B9620 Unspecified Escherichia coli [E. coli] as the cause of diseases classified elsewhere: Secondary | ICD-10-CM | POA: Diagnosis not present

## 2022-10-30 DIAGNOSIS — N83209 Unspecified ovarian cyst, unspecified side: Secondary | ICD-10-CM | POA: Diagnosis present

## 2022-10-30 DIAGNOSIS — Z79899 Other long term (current) drug therapy: Secondary | ICD-10-CM

## 2022-10-30 DIAGNOSIS — D696 Thrombocytopenia, unspecified: Secondary | ICD-10-CM | POA: Diagnosis present

## 2022-10-30 HISTORY — PX: ROBOTIC ASSISTED SALPINGO OOPHERECTOMY: SHX6082

## 2022-10-30 HISTORY — PX: ROBOTIC ASSISTED LAPAROSCOPIC OVARIAN CYSTECTOMY: SHX6081

## 2022-10-30 LAB — CBC
HCT: 35 % — ABNORMAL LOW (ref 36.0–46.0)
HCT: 35.4 % — ABNORMAL LOW (ref 36.0–46.0)
Hemoglobin: 11.4 g/dL — ABNORMAL LOW (ref 12.0–15.0)
Hemoglobin: 11.5 g/dL — ABNORMAL LOW (ref 12.0–15.0)
MCH: 29.9 pg (ref 26.0–34.0)
MCH: 30 pg (ref 26.0–34.0)
MCHC: 32.6 g/dL (ref 30.0–36.0)
MCHC: 32.5 g/dL (ref 30.0–36.0)
MCV: 91.9 fL (ref 80.0–100.0)
MCV: 92.4 fL (ref 80.0–100.0)
Platelets: 193 10*3/uL (ref 150–400)
Platelets: 162 10*3/uL (ref 150–400)
RBC: 3.81 MIL/uL — ABNORMAL LOW (ref 3.87–5.11)
RBC: 3.83 MIL/uL — ABNORMAL LOW (ref 3.87–5.11)
RDW: 14.5 % (ref 11.5–15.5)
RDW: 14.5 % (ref 11.5–15.5)
WBC: 4.2 10*3/uL (ref 4.0–10.5)
WBC: 14.7 10*3/uL — ABNORMAL HIGH (ref 4.0–10.5)
nRBC: 0 % (ref 0.0–0.2)
nRBC: 0 % (ref 0.0–0.2)

## 2022-10-30 LAB — BASIC METABOLIC PANEL
Anion gap: 11 (ref 5–15)
BUN: 8 mg/dL (ref 6–20)
CO2: 19 mmol/L — ABNORMAL LOW (ref 22–32)
Calcium: 8.7 mg/dL — ABNORMAL LOW (ref 8.9–10.3)
Chloride: 105 mmol/L (ref 98–111)
Creatinine, Ser: 0.63 mg/dL (ref 0.44–1.00)
GFR, Estimated: >60 mL/min (ref >60)
Glucose, Bld: 99 mg/dL (ref 70–99)
Potassium: 4 mmol/L (ref 3.5–5.1)
Sodium: 135 mmol/L (ref 135–145)

## 2022-10-30 LAB — URINALYSIS, ROUTINE W REFLEX MICROSCOPIC
Bilirubin Urine: NEGATIVE
Glucose, UA: NEGATIVE mg/dL
Ketones, ur: NEGATIVE mg/dL
Leukocytes,Ua: NEGATIVE
Nitrite: POSITIVE — AB
Protein, ur: NEGATIVE mg/dL
Specific Gravity, Urine: 1.01 (ref 1.005–1.030)
pH: 7 (ref 5.0–8.0)

## 2022-10-30 LAB — COMPREHENSIVE METABOLIC PANEL
ALT: 14 U/L (ref 0–44)
AST: 15 U/L (ref 15–41)
Albumin: 4.2 g/dL (ref 3.5–5.0)
Alkaline Phosphatase: 40 U/L (ref 38–126)
Anion gap: 7 (ref 5–15)
BUN: 12 mg/dL (ref 6–20)
CO2: 25 mmol/L (ref 22–32)
Calcium: 8.9 mg/dL (ref 8.9–10.3)
Chloride: 107 mmol/L (ref 98–111)
Creatinine, Ser: 0.73 mg/dL (ref 0.44–1.00)
GFR, Estimated: >60 mL/min (ref >60)
Glucose, Bld: 96 mg/dL (ref 70–99)
Potassium: 3.9 mmol/L (ref 3.5–5.1)
Sodium: 139 mmol/L (ref 135–145)
Total Bilirubin: 0.7 mg/dL (ref 0.3–1.2)
Total Protein: 6.8 g/dL (ref 6.5–8.1)

## 2022-10-30 LAB — LIPASE, BLOOD: Lipase: 24 U/L (ref 11–51)

## 2022-10-30 LAB — POC URINE PREG, ED: Preg Test, Ur: NEGATIVE

## 2022-10-30 SURGERY — EXCISION, CYST, OVARY, ROBOT-ASSISTED, LAPAROSCOPIC
Anesthesia: General | Laterality: Left

## 2022-10-30 MED ORDER — GLYCOPYRROLATE 0.2 MG/ML IJ SOLN
INTRAMUSCULAR | Status: DC | PRN
  Administered 2022-10-30: .2 mg via INTRAVENOUS

## 2022-10-30 MED ORDER — HYDROMORPHONE HCL 1 MG/ML IJ SOLN
INTRAMUSCULAR | Status: AC
  Filled 2022-10-30: qty 1

## 2022-10-30 MED ORDER — LACTATED RINGERS IV SOLN: INTRAVENOUS | Status: DC

## 2022-10-30 MED ORDER — ACETAMINOPHEN 10 MG/ML IV SOLN
INTRAVENOUS | Status: AC
  Filled 2022-10-30: qty 100

## 2022-10-30 MED ORDER — KETOROLAC TROMETHAMINE 30 MG/ML IJ SOLN
INTRAMUSCULAR | Status: AC
  Filled 2022-10-30: qty 1

## 2022-10-30 MED ORDER — HYDROMORPHONE HCL 1 MG/ML IJ SOLN
0.5000 mg | Freq: Once | INTRAMUSCULAR | Status: AC
  Administered 2022-10-30: 0.5 mg via INTRAVENOUS
  Filled 2022-10-30: qty 0.5

## 2022-10-30 MED ORDER — ONDANSETRON HCL 4 MG PO TABS
4.0000 mg | ORAL_TABLET | Freq: Four times a day (QID) | ORAL | Status: DC | PRN
  Administered 2022-10-31: 4 mg via ORAL
  Filled 2022-10-30: qty 1

## 2022-10-30 MED ORDER — ORAL CARE MOUTH RINSE: 15.0000 mL | Freq: Once | OROMUCOSAL | Status: AC

## 2022-10-30 MED ORDER — MIDAZOLAM HCL 2 MG/2ML IJ SOLN
INTRAMUSCULAR | Status: AC
  Filled 2022-10-30: qty 2

## 2022-10-30 MED ORDER — DEXMEDETOMIDINE HCL IN NACL 80 MCG/20ML IV SOLN
INTRAVENOUS | Status: DC | PRN
  Administered 2022-10-30 (×3): 4 ug via INTRAVENOUS

## 2022-10-30 MED ORDER — ACETAMINOPHEN 10 MG/ML IV SOLN: 1000.0000 mg | Freq: Once | INTRAVENOUS | Status: DC | PRN

## 2022-10-30 MED ORDER — FENTANYL CITRATE (PF) 100 MCG/2ML IJ SOLN
INTRAMUSCULAR | Status: AC
  Filled 2022-10-30: qty 2

## 2022-10-30 MED ORDER — HEMOSTATIC AGENTS (NO CHARGE) OPTIME
TOPICAL | Status: DC | PRN
Start: 2022-10-30 — End: 2022-10-30
  Administered 2022-10-30: 1 via TOPICAL

## 2022-10-30 MED ORDER — ALBUMIN HUMAN 5 % IV SOLN
INTRAVENOUS | Status: AC
  Filled 2022-10-30: qty 250

## 2022-10-30 MED ORDER — SEVOFLURANE IN SOLN
RESPIRATORY_TRACT | Status: AC
  Filled 2022-10-30: qty 250

## 2022-10-30 MED ORDER — ROCURONIUM BROMIDE 100 MG/10ML IV SOLN
INTRAVENOUS | Status: DC | PRN
  Administered 2022-10-30: 50 mg via INTRAVENOUS
  Administered 2022-10-30: 20 mg via INTRAVENOUS
  Administered 2022-10-30 (×2): 10 mg via INTRAVENOUS

## 2022-10-30 MED ORDER — GLYCOPYRROLATE 0.2 MG/ML IJ SOLN
INTRAMUSCULAR | Status: AC
  Filled 2022-10-30: qty 1

## 2022-10-30 MED ORDER — 0.9 % SODIUM CHLORIDE (POUR BTL) OPTIME
TOPICAL | Status: DC | PRN
  Administered 2022-10-30: 500 mL

## 2022-10-30 MED ORDER — CHLORHEXIDINE GLUCONATE 0.12 % MT SOLN
OROMUCOSAL | Status: AC
  Filled 2022-10-30: qty 15

## 2022-10-30 MED ORDER — LIDOCAINE HCL (CARDIAC) PF 100 MG/5ML IV SOSY
PREFILLED_SYRINGE | INTRAVENOUS | Status: DC | PRN
  Administered 2022-10-30: 60 mg via INTRAVENOUS

## 2022-10-30 MED ORDER — LIDOCAINE HCL (PF) 2 % IJ SOLN
INTRAMUSCULAR | Status: AC
  Filled 2022-10-30: qty 5

## 2022-10-30 MED ORDER — MORPHINE SULFATE (PF) 4 MG/ML IV SOLN
4.0000 mg | Freq: Once | INTRAVENOUS | Status: AC
  Administered 2022-10-30: 4 mg via INTRAVENOUS
  Filled 2022-10-30: qty 1

## 2022-10-30 MED ORDER — OXYCODONE HCL 5 MG PO TABS
ORAL_TABLET | ORAL | Status: AC
  Filled 2022-10-30: qty 1

## 2022-10-30 MED ORDER — DOCUSATE SODIUM 100 MG PO CAPS
100.0000 mg | ORAL_CAPSULE | Freq: Two times a day (BID) | ORAL | Status: DC
  Administered 2022-10-31 – 2022-11-05 (×11): 100 mg via ORAL
  Filled 2022-10-30 (×14): qty 1

## 2022-10-30 MED ORDER — ONDANSETRON HCL 4 MG/2ML IJ SOLN
INTRAMUSCULAR | Status: AC
  Filled 2022-10-30: qty 2

## 2022-10-30 MED ORDER — OXYCODONE HCL 5 MG PO TABS
5.0000 mg | ORAL_TABLET | ORAL | Status: DC | PRN
  Administered 2022-10-31: 5 mg via ORAL
  Administered 2022-10-31 – 2022-11-02 (×6): 10 mg via ORAL
  Administered 2022-11-02: 5 mg via ORAL
  Administered 2022-11-02 – 2022-11-05 (×10): 10 mg via ORAL
  Filled 2022-10-30 (×9): qty 2
  Filled 2022-10-30: qty 1
  Filled 2022-10-30 (×6): qty 2
  Filled 2022-10-30: qty 1
  Filled 2022-10-30: qty 2

## 2022-10-30 MED ORDER — PHENYLEPHRINE HCL-NACL 20-0.9 MG/250ML-% IV SOLN
INTRAVENOUS | Status: DC | PRN
  Administered 2022-10-30: 20 ug/min via INTRAVENOUS
  Administered 2022-10-30: 80 ug via INTRAVENOUS

## 2022-10-30 MED ORDER — KETAMINE HCL 50 MG/5ML IJ SOSY
PREFILLED_SYRINGE | INTRAMUSCULAR | Status: AC
  Filled 2022-10-30: qty 5

## 2022-10-30 MED ORDER — LACTATED RINGERS IV BOLUS
1000.0000 mL | Freq: Once | INTRAVENOUS | Status: AC
  Administered 2022-10-30: 1000 mL via INTRAVENOUS

## 2022-10-30 MED ORDER — HYDROMORPHONE HCL 1 MG/ML IJ SOLN
0.5000 mg | INTRAMUSCULAR | Status: DC | PRN
  Administered 2022-10-30 (×2): 0.5 mg via INTRAVENOUS

## 2022-10-30 MED ORDER — PROPOFOL 1000 MG/100ML IV EMUL
INTRAVENOUS | Status: AC
  Filled 2022-10-30: qty 100

## 2022-10-30 MED ORDER — KETAMINE HCL 10 MG/ML IJ SOLN
INTRAMUSCULAR | Status: DC | PRN
Start: 2022-10-30 — End: 2022-10-30
  Administered 2022-10-30: 20 mg via INTRAVENOUS
  Administered 2022-10-30 (×3): 10 mg via INTRAVENOUS

## 2022-10-30 MED ORDER — KETOROLAC TROMETHAMINE 30 MG/ML IJ SOLN
30.0000 mg | Freq: Four times a day (QID) | INTRAMUSCULAR | Status: DC
  Administered 2022-10-30 – 2022-10-31 (×2): 30 mg via INTRAVENOUS
  Filled 2022-10-30: qty 1

## 2022-10-30 MED ORDER — IBUPROFEN 600 MG PO TABS
600.0000 mg | ORAL_TABLET | Freq: Four times a day (QID) | ORAL | Status: DC
  Filled 2022-10-30: qty 1

## 2022-10-30 MED ORDER — PROPOFOL 10 MG/ML IV BOLUS
INTRAVENOUS | Status: AC
  Filled 2022-10-30: qty 20

## 2022-10-30 MED ORDER — PHENYLEPHRINE HCL-NACL 20-0.9 MG/250ML-% IV SOLN
INTRAVENOUS | Status: AC
  Filled 2022-10-30: qty 250

## 2022-10-30 MED ORDER — BUPIVACAINE HCL (PF) 0.5 % IJ SOLN
INTRAMUSCULAR | Status: AC
  Filled 2022-10-30: qty 30

## 2022-10-30 MED ORDER — EPHEDRINE SULFATE (PRESSORS) 50 MG/ML IJ SOLN
10.0000 mg | Freq: Once | INTRAMUSCULAR | Status: AC
  Administered 2022-10-30: 10 mg via INTRAVENOUS

## 2022-10-30 MED ORDER — MIDAZOLAM HCL 2 MG/2ML IJ SOLN
INTRAMUSCULAR | Status: DC | PRN
  Administered 2022-10-30: 2 mg via INTRAVENOUS

## 2022-10-30 MED ORDER — SODIUM CHLORIDE 0.9 % IV BOLUS
500.0000 mL | Freq: Once | INTRAVENOUS | Status: AC
  Administered 2022-10-30: 500 mL via INTRAVENOUS

## 2022-10-30 MED ORDER — EPHEDRINE SULFATE (PRESSORS) 50 MG/ML IJ SOLN
INTRAMUSCULAR | Status: AC
  Filled 2022-10-30: qty 1

## 2022-10-30 MED ORDER — SIMETHICONE 80 MG PO CHEW
80.0000 mg | CHEWABLE_TABLET | Freq: Four times a day (QID) | ORAL | Status: DC | PRN
  Administered 2022-10-31: 80 mg via ORAL
  Filled 2022-10-30: qty 1

## 2022-10-30 MED ORDER — CHLORHEXIDINE GLUCONATE 0.12 % MT SOLN
15.0000 mL | Freq: Once | OROMUCOSAL | Status: AC
  Administered 2022-10-30: 15 mL via OROMUCOSAL

## 2022-10-30 MED ORDER — LACTATED RINGERS IV SOLN: INTRAVENOUS | Status: DC | PRN

## 2022-10-30 MED ORDER — PROPOFOL 10 MG/ML IV BOLUS
INTRAVENOUS | Status: DC | PRN
  Administered 2022-10-30: 40 ug/kg/min via INTRAVENOUS

## 2022-10-30 MED ORDER — SUGAMMADEX SODIUM 200 MG/2ML IV SOLN
INTRAVENOUS | Status: DC | PRN
  Administered 2022-10-30: 120 mg via INTRAVENOUS

## 2022-10-30 MED ORDER — DROPERIDOL 2.5 MG/ML IJ SOLN
INTRAMUSCULAR | Status: AC
  Filled 2022-10-30: qty 2

## 2022-10-30 MED ORDER — BUPIVACAINE HCL (PF) 0.5 % IJ SOLN
INTRAMUSCULAR | Status: DC | PRN
  Administered 2022-10-30: 12 mL

## 2022-10-30 MED ORDER — OXYCODONE HCL 5 MG PO TABS
5.0000 mg | ORAL_TABLET | Freq: Once | ORAL | Status: AC | PRN
  Administered 2022-10-30: 5 mg via ORAL

## 2022-10-30 MED ORDER — ALBUMIN HUMAN 5 % IV SOLN
12.5000 g | Freq: Once | INTRAVENOUS | Status: AC
  Administered 2022-10-30: 12.5 g via INTRAVENOUS

## 2022-10-30 MED ORDER — ONDANSETRON HCL 4 MG/2ML IJ SOLN
4.0000 mg | Freq: Once | INTRAMUSCULAR | Status: AC
  Administered 2022-10-30: 4 mg via INTRAVENOUS
  Filled 2022-10-30: qty 2

## 2022-10-30 MED ORDER — CLONAZEPAM 0.5 MG PO TBDP
1.0000 mg | ORAL_TABLET | Freq: Two times a day (BID) | ORAL | Status: DC | PRN
  Administered 2022-10-31 – 2022-11-01 (×3): 1 mg via ORAL
  Filled 2022-10-30: qty 2
  Filled 2022-10-30: qty 4
  Filled 2022-10-30: qty 1

## 2022-10-30 MED ORDER — HYDROMORPHONE HCL 1 MG/ML IJ SOLN
INTRAMUSCULAR | Status: DC | PRN
Start: 2022-10-30 — End: 2022-10-30
  Administered 2022-10-30 (×2): .5 mg via INTRAVENOUS

## 2022-10-30 MED ORDER — FENTANYL CITRATE (PF) 100 MCG/2ML IJ SOLN
25.0000 ug | INTRAMUSCULAR | Status: DC | PRN
  Administered 2022-10-30 (×3): 50 ug via INTRAVENOUS

## 2022-10-30 MED ORDER — GABAPENTIN 100 MG PO CAPS
100.0000 mg | ORAL_CAPSULE | Freq: Every day | ORAL | Status: DC
  Administered 2022-10-30: 100 mg via ORAL

## 2022-10-30 MED ORDER — ONDANSETRON HCL 4 MG/2ML IJ SOLN
4.0000 mg | Freq: Once | INTRAMUSCULAR | Status: AC | PRN
  Administered 2022-10-30: 4 mg via INTRAVENOUS

## 2022-10-30 MED ORDER — GABAPENTIN 100 MG PO CAPS: 100.0000 mg | ORAL_CAPSULE | Freq: Every day | ORAL | Status: DC

## 2022-10-30 MED ORDER — FENTANYL CITRATE (PF) 100 MCG/2ML IJ SOLN
INTRAMUSCULAR | Status: DC | PRN
  Administered 2022-10-30 (×4): 25 ug via INTRAVENOUS

## 2022-10-30 MED ORDER — GABAPENTIN 100 MG PO CAPS
ORAL_CAPSULE | ORAL | Status: AC
  Filled 2022-10-30: qty 1

## 2022-10-30 MED ORDER — FENTANYL CITRATE (PF) 100 MCG/2ML IJ SOLN
50.0000 ug | Freq: Once | INTRAMUSCULAR | Status: AC
  Administered 2022-10-30: 50 ug via INTRAVENOUS

## 2022-10-30 MED ORDER — DEXAMETHASONE SODIUM PHOSPHATE 10 MG/ML IJ SOLN
INTRAMUSCULAR | Status: DC | PRN
  Administered 2022-10-30: 5 mg via INTRAVENOUS

## 2022-10-30 MED ORDER — ONDANSETRON HCL 4 MG/2ML IJ SOLN
4.0000 mg | Freq: Four times a day (QID) | INTRAMUSCULAR | Status: DC | PRN
  Administered 2022-10-31 – 2022-11-05 (×7): 4 mg via INTRAVENOUS
  Filled 2022-10-30 (×7): qty 2

## 2022-10-30 MED ORDER — DROPERIDOL 2.5 MG/ML IJ SOLN
0.6250 mg | Freq: Once | INTRAMUSCULAR | Status: AC
  Administered 2022-10-30: 0.625 mg via INTRAVENOUS

## 2022-10-30 MED ORDER — MENTHOL 3 MG MT LOZG: 1.0000 | LOZENGE | OROMUCOSAL | Status: DC | PRN

## 2022-10-30 MED ORDER — ROCURONIUM BROMIDE 10 MG/ML (PF) SYRINGE
PREFILLED_SYRINGE | INTRAVENOUS | Status: AC
  Filled 2022-10-30: qty 10

## 2022-10-30 MED ORDER — ALUM & MAG HYDROXIDE-SIMETH 200-200-20 MG/5ML PO SUSP: 30.0000 mL | ORAL | Status: DC | PRN

## 2022-10-30 MED ORDER — POVIDONE-IODINE 10 % EX SWAB
2.0000 | Freq: Once | CUTANEOUS | Status: AC
  Administered 2022-10-30: 2 via TOPICAL

## 2022-10-30 MED ORDER — OXYCODONE HCL 5 MG/5ML PO SOLN: 5.0000 mg | Freq: Once | ORAL | Status: AC | PRN

## 2022-10-30 MED ORDER — LORAZEPAM 2 MG/ML IJ SOLN
1.0000 mg | Freq: Once | INTRAMUSCULAR | Status: AC
  Administered 2022-10-30: 1 mg via INTRAVENOUS
  Filled 2022-10-30: qty 1

## 2022-10-30 MED ORDER — DEXAMETHASONE SODIUM PHOSPHATE 10 MG/ML IJ SOLN
INTRAMUSCULAR | Status: AC
  Filled 2022-10-30: qty 1

## 2022-10-30 SURGICAL SUPPLY — 64 items
ADH SKN CLS APL DERMABOND .7 (GAUZE/BANDAGES/DRESSINGS) ×1
ANCHOR TIS RET SYS 235ML (MISCELLANEOUS) IMPLANT
APL SRG 38 LTWT LNG FL B (MISCELLANEOUS) ×1
APPLICATOR ARISTA FLEXITIP XL (MISCELLANEOUS) IMPLANT
BAG DRN RND TRDRP ANRFLXCHMBR (UROLOGICAL SUPPLIES) ×1
BAG TISS RTRVL C235 10X14 (MISCELLANEOUS) ×1
BAG URINE DRAIN 2000ML AR STRL (UROLOGICAL SUPPLIES) ×1 IMPLANT
BLADE SURG SZ11 CARB STEEL (BLADE) ×1 IMPLANT
CANNULA REDUCER 12-8 DVNC XI (CANNULA) IMPLANT
CATH FOLEY 2WAY 5CC 16FR (CATHETERS) ×1
CATH URTH 16FR FL 2W BLN LF (CATHETERS) ×1 IMPLANT
COUNTER NEEDLE 20/40 LG (NEEDLE) ×1 IMPLANT
COVER TIP SHEARS 8 DVNC (MISCELLANEOUS) ×1 IMPLANT
COVER WAND RF STERILE (DRAPES) ×1 IMPLANT
DERMABOND ADVANCED .7 DNX12 (GAUZE/BANDAGES/DRESSINGS) ×1 IMPLANT
DRAPE ARM DVNC X/XI (DISPOSABLE) ×3 IMPLANT
DRAPE COLUMN DVNC XI (DISPOSABLE) ×1 IMPLANT
DRAPE ROBOT W/ LEGGING 30X125 (DRAPES) ×1 IMPLANT
DRAPE SHEET LG 3/4 BI-LAMINATE (DRAPES) ×1 IMPLANT
DRSG TEGADERM 2-3/8X2-3/4 SM (GAUZE/BANDAGES/DRESSINGS) IMPLANT
ELECT REM PT RETURN 9FT ADLT (ELECTROSURGICAL) ×1
ELECTRODE REM PT RTRN 9FT ADLT (ELECTROSURGICAL) ×1 IMPLANT
FORCEPS BPLR FENES DVNC XI (FORCEP) ×1 IMPLANT
GAUZE SPONGE 2X2 STRL 8-PLY (GAUZE/BANDAGES/DRESSINGS) IMPLANT
GLOVE BIO SURGEON STRL SZ7 (GLOVE) ×4 IMPLANT
GLOVE INDICATOR 7.5 STRL GRN (GLOVE) ×4 IMPLANT
GOWN STRL REUS W/ TWL LRG LVL3 (GOWN DISPOSABLE) ×4 IMPLANT
GOWN STRL REUS W/TWL LRG LVL3 (GOWN DISPOSABLE) ×4
GRASPER SUT TROCAR 14GX15 (MISCELLANEOUS) ×1 IMPLANT
HEMOSTAT ARISTA ABSORB 3G PWDR (HEMOSTASIS) IMPLANT
IRRIGATION STRYKERFLOW (MISCELLANEOUS) IMPLANT
IRRIGATOR STRYKERFLOW (MISCELLANEOUS) ×1
IV NS 1000ML (IV SOLUTION) ×1
IV NS 1000ML BAXH (IV SOLUTION) IMPLANT
KIT PINK PAD W/HEAD ARE REST (MISCELLANEOUS) ×1 IMPLANT
KIT PINK PAD W/HEAD ARM REST (MISCELLANEOUS) ×1 IMPLANT
LABEL OR SOLS (LABEL) ×1 IMPLANT
MANIFOLD NEPTUNE II (INSTRUMENTS) ×1 IMPLANT
MANIPULATOR UTERINE 4.5 ZUMI (MISCELLANEOUS) ×1 IMPLANT
NDL DRIVE SUT CUT DVNC (INSTRUMENTS) ×1 IMPLANT
NEEDLE DRIVE SUT CUT DVNC (INSTRUMENTS) ×1 IMPLANT
NS IRRIG 1000ML POUR BTL (IV SOLUTION) ×1 IMPLANT
OBTURATOR OPTICAL STND 8 DVNC (TROCAR) ×1
OBTURATOR OPTICALSTD 8 DVNC (TROCAR) ×1 IMPLANT
PACK GYN LAPAROSCOPIC (MISCELLANEOUS) ×1 IMPLANT
PAD OB MATERNITY 4.3X12.25 (PERSONAL CARE ITEMS) ×1 IMPLANT
PAD PREP OB/GYN DISP 24X41 (PERSONAL CARE ITEMS) ×1 IMPLANT
SCISSORS MNPLR CVD DVNC XI (INSTRUMENTS) ×1 IMPLANT
SCRUB CHG 4% DYNA-HEX 4OZ (MISCELLANEOUS) ×1 IMPLANT
SEAL UNIV 5-12 XI (MISCELLANEOUS) ×3 IMPLANT
SEALER VESSEL EXT DVNC XI (MISCELLANEOUS) IMPLANT
SET CYSTO W/LG BORE CLAMP LF (SET/KITS/TRAYS/PACK) IMPLANT
SET TUBE SMOKE EVAC HIGH FLOW (TUBING) ×1 IMPLANT
SOL ELECTROSURG ANTI STICK (MISCELLANEOUS) ×1
SOL PREP PVP 2OZ (MISCELLANEOUS) ×1
SOLUTION ELECTROSURG ANTI STCK (MISCELLANEOUS) ×1 IMPLANT
SOLUTION PREP PVP 2OZ (MISCELLANEOUS) ×1 IMPLANT
SURGILUBE 2OZ TUBE FLIPTOP (MISCELLANEOUS) ×1 IMPLANT
SUT MNCRL 4-0 (SUTURE) ×1
SUT MNCRL 4-0 27XMFL (SUTURE) ×1
SUT VIC AB 0 CT2 27 (SUTURE) ×2 IMPLANT
SUT VLOC 90 2/L VL 12 GS22 (SUTURE) IMPLANT
SUTURE MNCRL 4-0 27XMF (SUTURE) ×1 IMPLANT
WATER STERILE IRR 500ML POUR (IV SOLUTION) ×1 IMPLANT

## 2022-10-30 NOTE — ED Triage Notes (Signed)
Pt comes with c/o belly pain for several days. Pt stats she was seen here and dx with cyst. Pt was suppose to be admitted but she had to leave. Pt states intense pain and has been taking medications with no relief. Pt states she has been on antibiotics too.

## 2022-10-30 NOTE — Progress Notes (Signed)
Pt arrived and received to mother baby unit room 347 for overnight admission from PACU.Bedside report received from PACU RN. Pt in stable conditions, 3 incisions sites verified with no bleeding and dressing intact. Pt VSS, Iv infusing and pt AAO x4 and oriented to unit and room. No new needs expressed at this time. Pt personal items within reach and pt educated on safety and fall precautions and given call light to call for assistance.

## 2022-10-30 NOTE — H&P (Addendum)
Consult History and Physical   SERVICE: Gynecology  Patient Name: Amy Salas Patient MRN:   [id number]  CC: Pelvic pain  HPI: [facility] Amy Salas is a 32 y.o. G1P1001 with left lower quadrant pain, with hemorrhagic ovarian cyst   Review of Systems: positives in bold GEN:   fevers, chills, weight changes, appetite changes, fatigue, night sweats HEENT:  HA, vision changes, hearing loss, congestion, rhinorrhea, sinus pressure, dysphagia CV:   CP, palpitations PULM:  SOB, cough GI:  abd pain, N/V/D/C GU:  dysuria, urgency, frequency MSK:  arthralgias, myalgias, back pain, swelling SKIN:  rashes, color changes, pallor NEURO:  numbness, weakness, tingling, seizures, dizziness, tremors PSYCH:  depression, anxiety, behavioral problems, confusion  HEME/LYMPH:  easy bruising or bleeding ENDO:  heat/cold intolerance  Past Obstetrical History: OB History     Gravida  1   Para  1   Term  1   Preterm      AB      Living  1      SAB      IAB      Ectopic      Multiple      Live Births  1           Past Gynecologic History: Patient's last menstrual period was 10/03/2022.  Past Medical History: Past Medical History:  Diagnosis Date   Ovarian cyst    POTS (postural orthostatic tachycardia syndrome)    PTSD (post-traumatic stress disorder)    Seizure (HCC)     Past Surgical History:   Past Surgical History:  Procedure Laterality Date   CHOLECYSTECTOMY     INGUINAL HERNIA REPAIR Bilateral    LAPAROSCOPIC APPENDECTOMY N/A 10/23/2020   Procedure: APPENDECTOMY LAPAROSCOPIC;  Surgeon: Leafy Ro, MD;  Location: [facility] ORS;  Service: General;  Laterality: N/A;    Family History:  family history is not on file.  Social History:  Social History   Socioeconomic History   Marital status: Single    Spouse name: Not on file   Number of children: Not on file   Years of education: Not on file   Highest education level: Not on file   Occupational History   Not on file  Tobacco Use   Smoking status: Never   Smokeless tobacco: Never  Substance and Sexual Activity   Alcohol use: Not on file   Drug use: Not on file   Sexual activity: Not on file  Other Topics Concern   Not on file  Social History Narrative   Not on file   Social Determinants of Health   Financial Resource Strain: Not on file  Food Insecurity: Not on file  Transportation Needs: Not on file  Physical Activity: Not on file  Stress: Not on file  Social Connections: Not on file  Intimate Partner Violence: Not on file    Home Medications:  Medications reconciled in EPIC  No current facility-administered medications on file prior to encounter.   Current Outpatient Medications on File Prior to Encounter  Medication Sig Dispense Refill   cephALEXin (KEFLEX) 500 MG capsule Take 1 capsule (500 mg total) by mouth 3 (three) times daily. 30 capsule 0   ibuprofen (ADVIL) 600 MG tablet Take 1 tablet (600 mg total) by mouth every 6 (six) hours as needed. 30 tablet 0   metroNIDAZOLE (FLAGYL) 500 MG tablet Take 1 tablet (500 mg total) by mouth 2 (two) times daily for 7 days. 14 tablet 0   ondansetron (ZOFRAN-ODT) 8 MG  disintegrating tablet Take 1 tablet (8 mg total) by mouth every 8 (eight) hours as needed for nausea or vomiting. 20 tablet 0   oxyCODONE (OXY IR/ROXICODONE) 5 MG immediate release tablet Take 1 tablet (5 mg total) by mouth every 4 (four) hours as needed for severe pain or breakthrough pain. 30 tablet 0   oxyCODONE-acetaminophen (PERCOCET) 5-325 MG tablet Take 1 tablet by mouth every 8 (eight) hours as needed for severe pain. 15 tablet 0   valACYclovir (VALTREX) 500 MG tablet Take 500 mg by mouth 2 (two) times daily as needed.      Allergies:  Allergies  Allergen Reactions   Bactrim [Sulfamethoxazole-Trimethoprim] Other (See Comments)    seizure   Sulfa Antibiotics     seizures    Physical Exam:  Temp:  [98 F (36.7 C)] 98 F (36.7 C)  (08/29 1055) Pulse Rate:  [81-91] 81 (08/29 1126) Resp:  [18] 18 (08/29 1126) BP: (97)/(67-72) 97/72 (08/29 1126) SpO2:  [99 %-100 %] 100 % (08/29 1126) Weight:  [55.3 kg] 55.3 kg (08/29 1449)   General Appearance:  Well developed, well nourished, no acute distress, alert and oriented x3 HEENT:  Normocephalic atraumatic, extraocular movements intact, moist mucous membranes Cardiovascular:  Normal S1/S2, regular rate and rhythm, no murmurs Pulmonary:  clear to auscultation, no wheezes, rales or rhonchi, symmetric air entry, good air exchange Abdomen:  Bowel sounds present, soft, nontender, nondistended, no abnormal masses, no epigastric pain Extremities:  Full range of motion, no pedal edema, 2+ distal pulses, no tenderness Skin:  normal coloration and turgor, no rashes, no suspicious skin lesions noted  Neurologic:  Cranial nerves 2-12 grossly intact, normal muscle tone, strength 5/5 all four extremities Psychiatric:  Normal mood and affect, appropriate, no AH/VH Pelvic:  deferred    Labs/Studies:   CBC and Coags:  Lab Results  Component Value Date   WBC 4.2 10/30/2022   HGB 11.4 (L) 10/30/2022   HCT 35.0 (L) 10/30/2022   MCV 91.9 10/30/2022   PLT 193 10/30/2022   CMP:  Lab Results  Component Value Date   NA 139 10/30/2022   K 3.9 10/30/2022   CL 107 10/30/2022   CO2 25 10/30/2022   BUN 12 10/30/2022   CREATININE 0.73 10/30/2022   CREATININE 0.73 10/25/2022   CREATININE 0.72 10/23/2022   PROT 6.8 10/30/2022   BILITOT 0.7 10/30/2022   BILIDIR 0.1 10/23/2020   ALT 14 10/30/2022   AST 15 10/30/2022   ALKPHOS 40 10/30/2022   Other Labs:   Other Imaging: US PELVIC COMPLETE W TRANSVAGINAL AND TORSION R/O  Result Date: 10/30/2022 CLINICAL DATA:  Abdominal pain for 2 weeks EXAM: TRANSABDOMINAL AND TRANSVAGINAL ULTRASOUND OF PELVIS DOPPLER ULTRASOUND OF OVARIES TECHNIQUE: Both transabdominal and transvaginal ultrasound examinations of the pelvis were performed.  Transabdominal technique was performed for global imaging of the pelvis including uterus, ovaries, adnexal regions, and pelvic cul-de-sac. It was necessary to proceed with endovaginal exam following the transabdominal exam to visualize the adnexa and endometrium. Color and duplex Doppler ultrasound was utilized to evaluate blood flow to the ovaries. COMPARISON:  Ultrasound 10/25/2022 and older. FINDINGS: Uterus Measurements: 8.8 x 4.2 x 5.3 cm = volume: 102 mL. No fibroids or other mass visualized. Prominent uterine vessels. Endometrium Thickness: 10 mm.  No focal abnormality visualized. Right ovary Measurements: 4.0 x 2.3 x 2.4 cm = volume: 12 mL. Normal appearance/no adnexal mass. Left ovary Measurements: 5.1 x 3.4 x 3.6 cm = volume: 32 mL. There is complex hypoechoic  area in the left ovary measuring 2.7 x 2.8 x 2.6 cm today previously similar size focus was seen measuring 2.5 x 2.2 x 2.4 cm. Few more internal echoes in the structure no discrete abnormal blood flow within this structure. Pulsed Doppler evaluation of both ovaries demonstrates normal low-resistance arterial and venous waveforms. Other findings Small amount of free fluid in the pelvis. IMPRESSION: 2.8 cm complex cystic structure in the left ovary. This could be an evolving hemorrhagic or functional cyst based on overall appearance. Of note on older exam from 10/23/2022 left ovarian lesion was measured at 4.3 cm. Preserved blood flow to the left ovary. Trace free fluid in the pelvis. With a persistent pain would recommend follow up imaging in 6-12 weeks to confirm resolution. Electronically Signed   By: Karen Kays M.D.   On: 10/30/2022 13:37   US PELVIC COMPLETE W TRANSVAGINAL AND TORSION R/O  Result Date: 10/25/2022 CLINICAL DATA:  Pelvic pain beginning this morning. EXAM: TRANSABDOMINAL AND TRANSVAGINAL ULTRASOUND OF PELVIS DOPPLER ULTRASOUND OF OVARIES TECHNIQUE: Both transabdominal and transvaginal ultrasound examinations of the pelvis were  performed. Transabdominal technique was performed for global imaging of the pelvis including uterus, ovaries, adnexal regions, and pelvic cul-de-sac. It was necessary to proceed with endovaginal exam following the transabdominal exam to visualize the endometrium and adnexa. Color and duplex Doppler ultrasound was utilized to evaluate blood flow to the ovaries. COMPARISON:  Pelvic ultrasound 10/23/2022 FINDINGS: Uterus Measurements: 8.0 x 4.1 x 5.0 cm = volume: 86.5 mL. No fibroids or other mass visualized. Endometrium Thickness: 7 mm, within normal limits. No focal abnormality visualized. Right ovary Measurements: 2.8 x 2.3 x 2.0 cm = volume: 7.0 mL. Normal appearance/no adnexal mass. Left ovary Measurements: 4.3 x 3.6 x 2.8 cm = volume: 22.4 mL. The previously seen echogenic structure has changed. A hypoechoic lesion is now present, measuring 2.5 x 2.2 x 2.4 cm. This likely represents a broad treated cyst. Pulsed Doppler evaluation of both ovaries demonstrates normal low-resistance arterial and venous waveforms. Other findings Trace free fluid is present. IMPRESSION: 1. The previously seen echogenic structure in the left ovary has changed. A hypoechoic lesion is now present, measuring 2.5 x 2.2 x 2.4 cm. This likely represents a hemorrhagic cyst. 2. Trace free fluid in the pelvis is likely physiologic. 3. Normal appearance of the uterus and right ovary. Electronically Signed   By: Marin Roberts M.D.   On: 10/25/2022 10:47   US PELVIC COMPLETE W TRANSVAGINAL AND TORSION R/O  Result Date: 10/23/2022 CLINICAL DATA:  Sharp left lower quadrant pain EXAM: TRANSABDOMINAL AND TRANSVAGINAL ULTRASOUND OF PELVIS DOPPLER ULTRASOUND OF OVARIES TECHNIQUE: Both transabdominal and transvaginal ultrasound examinations of the pelvis were performed. Transabdominal technique was performed for global imaging of the pelvis including uterus, ovaries, adnexal regions, and pelvic cul-de-sac. It was necessary to proceed with  endovaginal exam following the transabdominal exam to visualize the ovaries. Color and duplex Doppler ultrasound was utilized to evaluate blood flow to the ovaries. COMPARISON:  None Available. FINDINGS: Uterus Measurements: 9.8 cm in sagittal dimension. No fibroids or other mass visualized. Endometrium Thickness: 9.  No focal abnormality visualized. Right ovary Measurements: 3.4 x 2.8 x 1.9 cm = volume: 9.7 mL. Normal appearance. No adnexal mass. Left ovary Measurements: 5.1 x 4.8 x 3.9 cm = volume: 50.9 mL. Contains a heterogeneously echogenic structure measuring 4.3 x 4.2 x 3.4 cm. No definite internal vascularity. Pulsed Doppler evaluation of both ovaries demonstrates normal low-resistance arterial and venous waveforms in the right  ovary and within the peripheral ovarian stroma in the left ovary. Other findings No abnormal free fluid. IMPRESSION: 1. Heterogeneously echogenic structure in the left ovary measuring up to 4.3 cm without appreciable central vascularity, which may represent a hemorrhagic cyst or endometrioma. Consider repeat ultrasound examination in 6 weeks to ensure resolution. 2. No current finding of ovarian torsion. Recommend repeat ultrasound examination with Doppler if there is acute worsening of symptoms as the asymmetrically enlarged size of the left ovary may act as a lead point for torsion. Electronically Signed   By: Agustin Cree M.D.   On: 10/23/2022 10:39     Assessment / Plan:   Amy Salas is a 32 y.o. G1P1001 who presents for her third visit to the ED for pelvic pain, with suspicion for ruptured ovarian cyst vs torsion. POTS syndrome. Prior lap chole and appy, ectopic pregnancy salpingectomy and D&C.    Consents signed today. Risks of dx lap with possible oophorectomy and salpingectomy and evacuation of hemoperitoneum if present were discussed with the patient including but not limited to: bleeding which may require transfusion; infection which may require  antibiotics; injury to uterus or surrounding organs; need for additional procedures including laparotomy or laparoscopy; and other postoperative/anesthesia complications. Written informed consent was obtained.  This is a scheduled same-day surgery. She will have a postop visit in 2 weeks to review operative findings and pathology.

## 2022-10-30 NOTE — Op Note (Signed)
Amy Salas PROCEDURE DATE: 10/30/2022  PREOPERATIVE DIAGNOSIS: Pelvic pain POSTOPERATIVE DIAGNOSIS: Ruptured hemorrhagic left ovarian cyst with hemoperitoneum. PROCEDURE:  XI ROBOTIC ASSISTED LAPAROSCOPIC OVARIAN CYSTECTOMY, DIAGNOSTIC LAPAROSCOPY: 96045 (CPT) XI ROBOTIC ASSISTED SALPINGO OOPHORECTOMY: 40981 (CPT)  SURGEON:  Dr. Christeen Douglas, MD ASSISTANT: CST, PA-S Su Hilt Anesthesiologist:  Anesthesiologist: Reed Breech, MD CRNA: Mathews Argyle, CRNA; Merlene Pulling, CRNA  INDICATIONS: 32 y.o. F here for definitive surgical management secondary to the indications listed under preoperative diagnoses; please see preoperative note for further details.   Complex adnexal mass found on ultrasound, with several weeks of severe pelvic pain.  Risks of surgery were discussed with the patient including but not limited to: bleeding which may require transfusion or reoperation; infection which may require antibiotics; injury to bowel, bladder, ureters or other surrounding organs; need for additional procedures; thromboembolic phenomenon, incisional problems and other postoperative/anesthesia complications. Written informed consent was obtained.    FINDINGS:   Pelvic: External genitalia negative for lesions. Vagina negative. Adnexa negative for masses or nodularity. Cervix without gross lesions. Uterus mobile, anteverted, small.   Intraoperative findings revealed a normal upper abdomen including bowel, diaphragmatic surfaces, stomach, and omentum.  The uterus was small and mobile.   Surgical absence of the gallbladder and the appendix were noted.  There were no significant pelvic or abdominal adhesions.  There was an Allen-Masters window that was very deep in the left ovarian fossa.  No active endometriosis was noted.  The right appeared Normal.  No right fallopian tube was noted. The left ovary was multi-lobular, with a splayed hemorrhagic ovarian cyst that was actively  bleeding bright red blood.  The cyst had ruptured at the base of the ovary where the ovarian vessels entered, and included in the destruction was the left fimbria.  These were actively bleeding bright red blood, even after I was able to control the ovarian blood supply.   ANESTHESIA:    General INTRAVENOUS FLUIDS:1000  ml ESTIMATED BLOOD LOSS: 130 URINE OUTPUT: 250 ml  SPECIMENS: Left fallopian tube and left ovary.  COMPLICATIONS: None immediate  PROCEDURE IN DETAIL: After informed consent was obtained, the patient was taken to the operating room where general anesthesia was obtained without difficulty. The patient was positioned in the dorsal lithotomy position in Fortescue stirrups and her arms were carefully tucked at her sides and the usual precautions were taken. Deep Trendelenburg (20-25 deg) was established to confirm that she does not shift on the table.  She was prepped and draped in normal sterile fashion.  Time-out was performed and a Foley catheter was placed into the bladder. A uterine sound was steri-striped to the single toothed tenaculum at the cervix to use as uterine manipulation.  After infiltration of local anesthetic at the proposed trocar sites and confirmation of an OG tube, a 5mm optiview was placed in the LUQ. Pneumoperitoneum was created to a pressure of 11 mmHg. The abdomen and pelvis was surveyed for the above findings.   A 12 mm mm incision was created in the right lower quadrant, and a robotic 12mm port was placed under direct visualization. The camera was placed and the abdomen surveyed, noting intact bowel below the site of entry. A survey of the pelvis and upper abdomen revealed the above findings.  Left lateral 8-mm robotic port was placed under direct visualization.  The patient was placed in deepTrendelenburg and the bowel was displaced up into the upper abdomen. The robot was left side docked. The instruments were placed under direct visualization.  The ureters  were identified bilaterally coursing outside of the operative field.   The left hemorrhagic cyst wall was resected from the left ovary, and passed off the field.  Significant bright red bleeding was unable to be controlled at the cyst base.  Use electrocautery in this area, and eventually made a pursestring suture of the block, after filling the ovarian cyst cavity with Arista.  These measures were insufficient to control the ovarian vessel bleeding.  The decision was made to remove the left ovary and ovariolysis was performed and the left ovary was dissected medially with care to avoid the ureter.  The infundibulopelvic ligaments were skeletonized, sealed and divided, 3cm from the ovary itself.  At this time, it was noted that the left fimbria continued to bleed.  They had apparently been attached when the cyst ruptured, and they were actively bleeding.  There was no way to save the fimbria without damage to the fimbria themselves.  With her history of prior ectopic pregnancy, the decision was made to remove the left tube as well.  This decision had been discussed with the patient prior to the case, and she consented actively to it.  The intraperitoneal pressure was dropped, and all planes of dissection, vascular pedicles  were found to be hemostatic.  The 12mm port fascia was closed with 2 figure of eights using 0 vicryl.  The robot was undocked. The lateral trocars were removed under visualization.  The CO2 gas was released and several deep breaths given to remove any remaining CO2 from the peritoneal cavity.  The skin incisions were closed with 4-0 Monocryl subcuticular stitch and dermabond. Sponge, lap and needle counts were correct x2.  The tenaculum was removed from the cervix and control of bleeding noted.  The Foley was also removed prior to leaving the OR.  The patient tolerated the procedure well and was taken to the recovery area awake and in stable condition. She received iv acetaminophen and  Toradol prior to leaving the OR.   The patient will be monitored overnight, given the significant level of her pain.  To home as per PACU criteria. Routine postoperative instructions given.  She would be prescribed Percocet, Ibuprofen and Colace.  She will follow up in the clinic in two weeks for postoperative evaluation.   Anesthesia was reversed without difficulty.

## 2022-10-30 NOTE — Transfer of Care (Signed)
Immediate Anesthesia Transfer of Care Note  Patient: Amy Salas  Procedure(s) Performed: XI ROBOTIC ASSISTED LAPAROSCOPIC OVARIAN CYSTECTOMY, DIAGNOSTIC LAPAROSCOPY (Left) XI ROBOTIC ASSISTED SALPINGO OOPHORECTOMY (Left)  Patient Location: PACU  Anesthesia Type:General  Level of Consciousness: drowsy  Airway & Oxygen Therapy: Patient Spontanous Breathing and Patient connected to nasal cannula oxygen  Post-op Assessment: Report given to RN and Post -op Vital signs reviewed and stable  Post vital signs: Reviewed  Last Vitals:  Vitals Value Taken Time  BP 89/56 10/30/22 1932  Temp 36.3 C 10/30/22 1930  Pulse 82 10/30/22 1932  Resp 12 10/30/22 1932  SpO2 100 % 10/30/22 1932    Last Pain:         Complications: No notable events documented.

## 2022-10-30 NOTE — Anesthesia Postprocedure Evaluation (Signed)
Anesthesia Post Note  Patient: Lashone Winings Cervone  Procedure(s) Performed: XI ROBOTIC ASSISTED LAPAROSCOPIC OVARIAN CYSTECTOMY, DIAGNOSTIC LAPAROSCOPY (Left) XI ROBOTIC ASSISTED SALPINGO OOPHORECTOMY (Left)  Patient location during evaluation: PACU Anesthesia Type: General Level of consciousness: awake and alert, oriented and patient cooperative Pain management: pain level controlled Vital Signs Assessment: post-procedure vital signs reviewed and stable Respiratory status: spontaneous breathing, nonlabored ventilation and respiratory function stable Cardiovascular status: blood pressure returned to baseline and stable Postop Assessment: adequate PO intake Anesthetic complications: no   No notable events documented.   Last Vitals:  Vitals:   10/30/22 2003 10/30/22 2011  BP:    Pulse: 61 62  Resp: 11 12  Temp:    SpO2: 100% 100%    Last Pain:  Vitals:   10/30/22 2011  TempSrc:   PainSc: 6                  Reed Breech

## 2022-10-30 NOTE — ED Notes (Signed)
Patient to ultrasound at this time.

## 2022-10-30 NOTE — ED Provider Notes (Signed)
[facility] Provider Note    Event Date/Time   First MD Initiated Contact with Patient 10/30/22 1133     (approximate)   History   Chief Complaint Abdominal Pain   HPI  Amy Salas is a 32 y.o. female with past medical history of seizure, POTS, and PTSD who presents to the ED complaining of abdominal pain.  Patient reports that she has been dealing with intermittent pain of the left lower quadrant of her abdomen for the past 2 weeks, seem to get acutely worse earlier this morning and has been constant since then.  She states that she has been seen in the ED for this same pain on 2 prior occasions, told that she has a cyst around her left ovary.  She states that last time she was seen 5 days ago, she was offered admission to the hospital, but declined due to issues with childcare.  She took previously prescribed pain medication at home without significant relief.     Physical Exam   Triage Vital Signs: ED Triage Vitals  Encounter Vitals Group     BP 10/30/22 1055 97/67     Systolic BP Percentile --      Diastolic BP Percentile --      Pulse Rate 10/30/22 1055 91     Resp 10/30/22 1055 18     Temp 10/30/22 1055 98 F (36.7 C)     Temp src --      SpO2 10/30/22 1055 99 %     Weight --      Height --      Head Circumference --      Peak Flow --      Pain Score 10/30/22 1054 10     Pain Loc --      Pain Education --      Exclude from Growth Chart --     Most recent vital signs: Vitals:   10/30/22 1055 10/30/22 1126  BP: 97/67 97/72  Pulse: 91 81  Resp: 18 18  Temp: 98 F (36.7 C)   SpO2: 99% 100%    Constitutional: Alert and oriented. Eyes: Conjunctivae are normal. Head: Atraumatic. Nose: No congestion/rhinnorhea. Mouth/Throat: Mucous membranes are moist.  Cardiovascular: Normal rate, regular rhythm. Grossly normal heart sounds.  2+ radial pulses bilaterally. Respiratory: Normal respiratory effort.  No retractions. Lungs  CTAB. Gastrointestinal: Soft and tender to palpation in the left lower quadrant with no rebound or guarding. No distention. Musculoskeletal: No lower extremity tenderness nor edema.  Neurologic:  Normal speech and language. No gross focal neurologic deficits are appreciated.    ED Results / Procedures / Treatments   Labs (all labs ordered are listed, but only abnormal results are displayed) Labs Reviewed  CBC - Abnormal; Notable for the following components:      Result Value   RBC 3.81 (*)    Hemoglobin 11.4 (*)    HCT 35.0 (*)    All other components within normal limits  URINALYSIS, ROUTINE W REFLEX MICROSCOPIC - Abnormal; Notable for the following components:   Color, Urine YELLOW (*)    APPearance HAZY (*)    Hgb urine dipstick SMALL (*)    Nitrite POSITIVE (*)    Bacteria, UA FEW (*)    All other components within normal limits  LIPASE, BLOOD  COMPREHENSIVE METABOLIC PANEL  POC URINE PREG, ED   RADIOLOGY Pelvic ultrasound reviewed and interpreted by me with left-sided ovarian cyst, no evidence of torsion.  PROCEDURES:  Critical Care performed: No  Procedures   MEDICATIONS ORDERED IN ED: Medications  LORazepam (ATIVAN) injection 1 mg (has no administration in time range)  morphine (PF) 4 MG/ML injection 4 mg (4 mg Intravenous Given 10/30/22 1206)  ondansetron (ZOFRAN) injection 4 mg (4 mg Intravenous Given 10/30/22 1206)  lactated ringers bolus 1,000 mL (1,000 mLs Intravenous New Bag/Given 10/30/22 1207)  HYDROmorphone (DILAUDID) injection 0.5 mg (0.5 mg Intravenous Given 10/30/22 1309)     IMPRESSION / MDM / ASSESSMENT AND PLAN / ED COURSE  I reviewed the triage vital signs and the nursing notes.                              32 y.o. female with past medical history of seizures, POTS, and PTSD who presents to the ED complaining of intermittent left lower quadrant abdominal pain for the past 1 to 2 weeks, acutely worse since this morning.  Patient's  presentation is most consistent with acute presentation with potential threat to life or bodily function.  Differential diagnosis includes, but is not limited to, ovarian torsion, ovarian cyst, ectopic pregnancy, diverticulitis, UTI, kidney stone.  Patient uncomfortable appearing but in no acute distress, vital signs are unremarkable.  Her abdomen is soft but she does seem to have some tenderness in the left lower quadrant of her abdomen, had previous ultrasound showing hemorrhagic cyst in this area.  She is at high risk for torsion and we will repeat her ultrasound today, treat symptomatically with IV morphine and Zofran.  Pregnancy testing is negative, labs are reassuring with no significant anemia, leukocytosis, toyed abnormality, or AKI.  LFTs and lipase are unremarkable, urinalysis shows no signs of infection.  Ultrasound shows left-sided ovarian cyst, slightly smaller than previous and with no evidence of torsion.  Unfortunately, patient continues to have significant pain and was given a dose of IV Dilaudid with improvement.  Case discussed with midwife Tami Lin of [facility], who discussed case with Dr. Dalbert Garnet recommends taking patient to the OR for ex lap.      FINAL CLINICAL IMPRESSION(S) / ED DIAGNOSES   Final diagnoses:  Cyst of left ovary     Rx / DC Orders   ED Discharge Orders     None        Note:  This document was prepared using Dragon voice recognition software and may include unintentional dictation errors.   Chesley Noon, MD 10/30/22 [phone]

## 2022-10-30 NOTE — Discharge Instructions (Addendum)
Laparoscopic Ovarian Surgery Discharge Instructions  For the next three days, take ibuprofen and acetaminophen on a schedule, every 8 hours. You can take them together or you can intersperse them, and take one every four hours. I also gave you gabapentin for nighttime, to help you sleep and also to control pain. Take gabapentin medicines at night for at least the next 3 nights. You also have a narcotic, oxycodone, to take as needed if the above medicines don't help.  Postop constipation is a major cause of pain. Stay well hydrated, walk as you tolerate, and take over the counter senna as well as stool softeners if you need them.   RISKS AND COMPLICATIONS  Infection. Bleeding. Injury to surrounding organs. Anesthetic side effects.   PROCEDURE  You may be given a medicine to help you relax (sedative) before the procedure. You will be given a medicine to make you sleep (general anesthetic) during the procedure. A tube will be put down your throat to help your breath while under general anesthesia. Several small cuts (incisions) are made in the lower abdominal area and one incision is made near the belly button. Your abdominal area will be inflated with a safe gas (carbon dioxide). This helps give the surgeon room to operate, visualize, and helps the surgeon avoid other organs. A thin, lighted tube (laparoscope) with a camera attached is inserted into your abdomen through the incision near the belly button. Other small instruments may also be inserted through other abdominal incisions. The ovary is located and are removed. After the ovary is removed, the gas is released from the abdomen. The incisions will be closed with stitches (sutures), and Dermabond. A bandage may be placed over the incisions.  AFTER THE PROCEDURE  You will also have some mild abdominal discomfort for 3-7 days. You will be given pain medicine to ease any discomfort. As long as there are no problems, you may be allowed to  go home. Someone will need to drive you home and be with you for at least 24 hours once home. You may have some mild discomfort in the throat. This is from the tube placed in your throat while you were sleeping. You may experience discomfort in the shoulder area from some trapped air between the liver and diaphragm. This sensation is normal and will slowly go away on its own.  HOME CARE INSTRUCTIONS  Take all medicines as directed. Only take over-the-counter or prescription medicines for pain, discomfort, or fever as directed by your caregiver. Resume daily activities as directed. Showers are preferred over baths for 2 weeks. You may resume sexual activities in 1 week or as you feel you would like to. Do not drive while taking narcotics.  SEEK MEDICAL CARE IF: . There is increasing abdominal pain. You feel lightheaded or faint. You have the chills. You have an oral temperature above 102 F (38.9 C). There is pus-like (purulent) drainage from any of the wounds. You are unable to pass gas or have a bowel movement. You feel sick to your stomach (nauseous) or throw up (vomit) and can't control it with your medicines.  MAKE SURE YOU:  Understand these instructions. Will watch your condition. Will get help right away if you are not doing well or get worse.  ExitCare Patient Information 2013 [place], [place].     Some PCP options in [place] area- not a comprehensive list [facility]- [phone] [facility]- [phone] [facility]- [phone] [facility]- [phone] [facility]- [phone] [facility]- [phone] or [facility]  Physician Referral Line [phone]

## 2022-10-30 NOTE — Anesthesia Preprocedure Evaluation (Addendum)
Anesthesia Evaluation  Patient identified by MRN, date of birth, ID band Patient awake    Reviewed: Allergy & Precautions, NPO status , Patient's Chart, lab work & pertinent test results  History of Anesthesia Complications Negative for: history of anesthetic complications  Airway Mallampati: I   Neck ROM: Full    Dental no notable dental hx.    Pulmonary neg pulmonary ROS   Pulmonary exam normal breath sounds clear to auscultation       Cardiovascular Normal cardiovascular exam Rhythm:Regular Rate:Normal  POTS   Neuro/Psych Seizures - (x1),  PSYCHIATRIC DISORDERS (PTSD) Anxiety Depression    Chronic back pain    GI/Hepatic negative GI ROS,,,  Endo/Other  negative endocrine ROS    Renal/GU negative Renal ROS     Musculoskeletal   Abdominal   Peds  Hematology negative hematology ROS (+)   Anesthesia Other Findings   Reproductive/Obstetrics                             Anesthesia Physical Anesthesia Plan  ASA: 2  Anesthesia Plan: General   Post-op Pain Management:    Induction: Intravenous  PONV Risk Score and Plan: 3 and Ondansetron, Dexamethasone and Treatment may vary due to age or medical condition  Airway Management Planned: Oral ETT  Additional Equipment:   Intra-op Plan:   Post-operative Plan: Extubation in OR  Informed Consent: I have reviewed the patients History and Physical, chart, labs and discussed the procedure including the risks, benefits and alternatives for the proposed anesthesia with the patient or authorized representative who has indicated his/her understanding and acceptance.     Dental advisory given  Plan Discussed with: CRNA  Anesthesia Plan Comments: (Patient consented for risks of anesthesia including but not limited to:  - adverse reactions to medications - damage to eyes, teeth, lips or other oral mucosa - nerve damage due to positioning   - sore throat or hoarseness - damage to heart, brain, nerves, lungs, other parts of body or loss of life  Informed patient about role of CRNA in peri- and intra-operative care.  Patient voiced understanding.)       Anesthesia Quick Evaluation

## 2022-10-30 NOTE — Anesthesia Procedure Notes (Signed)
Procedure Name: Intubation Date/Time: 10/30/2022 4:58 PM  Performed by: Merlene Pulling, CRNAPre-anesthesia Checklist: Patient identified, Patient being monitored, Timeout performed, Emergency Drugs available and Suction available Patient Re-evaluated:Patient Re-evaluated prior to induction Oxygen Delivery Method: Circle system utilized Preoxygenation: Pre-oxygenation with 100% oxygen Induction Type: IV induction Ventilation: Mask ventilation without difficulty Laryngoscope Size: 3 and McGraph Grade View: Grade I Tube type: Oral Tube size: 6.0 mm Number of attempts: 1 Airway Equipment and Method: Stylet Placement Confirmation: ETT inserted through vocal cords under direct vision, positive ETCO2 and breath sounds checked- equal and bilateral Secured at: 19 cm Tube secured with: Tape Dental Injury: Teeth and Oropharynx as per pre-operative assessment

## 2022-10-31 ENCOUNTER — Observation Stay: Payer: Medicaid Other

## 2022-10-31 ENCOUNTER — Encounter: Payer: Self-pay | Admitting: Obstetrics and Gynecology

## 2022-10-31 DIAGNOSIS — R1032 Left lower quadrant pain: Secondary | ICD-10-CM | POA: Diagnosis not present

## 2022-10-31 LAB — CBC
HCT: 27.3 % — ABNORMAL LOW (ref 36.0–46.0)
Hemoglobin: 9.3 g/dL — ABNORMAL LOW (ref 12.0–15.0)
MCH: 30.6 pg (ref 26.0–34.0)
MCHC: 34.1 g/dL (ref 30.0–36.0)
MCV: 89.8 fL (ref 80.0–100.0)
Platelets: 146 10*3/uL — ABNORMAL LOW (ref 150–400)
RBC: 3.04 MIL/uL — ABNORMAL LOW (ref 3.87–5.11)
RDW: 14.3 % (ref 11.5–15.5)
WBC: 7.5 10*3/uL (ref 4.0–10.5)
nRBC: 0 % (ref 0.0–0.2)

## 2022-10-31 LAB — BASIC METABOLIC PANEL
Anion gap: 8 (ref 5–15)
BUN: 8 mg/dL (ref 6–20)
CO2: 22 mmol/L (ref 22–32)
Calcium: 8 mg/dL — ABNORMAL LOW (ref 8.9–10.3)
Chloride: 106 mmol/L (ref 98–111)
Creatinine, Ser: 0.61 mg/dL (ref 0.44–1.00)
GFR, Estimated: >60 mL/min (ref >60)
Glucose, Bld: 136 mg/dL — ABNORMAL HIGH (ref 70–99)
Potassium: 3.6 mmol/L (ref 3.5–5.1)
Sodium: 136 mmol/L (ref 135–145)

## 2022-10-31 MED ORDER — IOHEXOL 9 MG/ML PO SOLN
500.0000 mL | ORAL | Status: AC
  Administered 2022-10-31 (×2): 500 mL via ORAL

## 2022-10-31 MED ORDER — GABAPENTIN 300 MG PO CAPS
300.0000 mg | ORAL_CAPSULE | Freq: Two times a day (BID) | ORAL | Status: AC
  Administered 2022-10-31 – 2022-11-02 (×6): 300 mg via ORAL
  Filled 2022-10-31 (×6): qty 1

## 2022-10-31 MED ORDER — POLYETHYLENE GLYCOL 3350 17 G PO PACK
17.0000 g | PACK | Freq: Two times a day (BID) | ORAL | Status: DC
  Administered 2022-11-03 (×2): 17 g via ORAL
  Filled 2022-10-31 (×9): qty 1

## 2022-10-31 MED ORDER — FLEET ENEMA RE ENEM
1.0000 | ENEMA | Freq: Every day | RECTAL | Status: DC | PRN
  Administered 2022-10-31: 1 via RECTAL

## 2022-10-31 MED ORDER — IBUPROFEN 400 MG PO TABS
800.0000 mg | ORAL_TABLET | Freq: Three times a day (TID) | ORAL | Status: DC
  Administered 2022-10-31 – 2022-11-01 (×3): 800 mg via ORAL
  Filled 2022-10-31: qty 2
  Filled 2022-10-31: qty 1
  Filled 2022-10-31: qty 2

## 2022-10-31 MED ORDER — HYDROMORPHONE HCL 1 MG/ML IJ SOLN
1.0000 mg | INTRAMUSCULAR | Status: DC | PRN
  Administered 2022-10-31 – 2022-11-02 (×12): 1 mg via INTRAVENOUS
  Administered 2022-11-03 (×4): 2 mg via INTRAVENOUS
  Administered 2022-11-04: 1 mg via INTRAVENOUS
  Administered 2022-11-04: 2 mg via INTRAVENOUS
  Administered 2022-11-05 (×2): 1 mg via INTRAVENOUS
  Filled 2022-10-31 (×6): qty 1
  Filled 2022-10-31: qty 2
  Filled 2022-10-31 (×2): qty 1
  Filled 2022-10-31: qty 2
  Filled 2022-10-31 (×2): qty 1
  Filled 2022-10-31: qty 2
  Filled 2022-10-31: qty 1
  Filled 2022-10-31: qty 2
  Filled 2022-10-31 (×2): qty 1
  Filled 2022-10-31: qty 2
  Filled 2022-10-31 (×3): qty 1

## 2022-10-31 MED ORDER — IOHEXOL 300 MG/ML  SOLN
75.0000 mL | Freq: Once | INTRAMUSCULAR | Status: AC | PRN
  Administered 2022-10-31: 75 mL via INTRAVENOUS

## 2022-10-31 MED ORDER — ACETAMINOPHEN 500 MG PO TABS
1000.0000 mg | ORAL_TABLET | Freq: Four times a day (QID) | ORAL | Status: AC
  Administered 2022-10-31 – 2022-11-02 (×11): 1000 mg via ORAL
  Filled 2022-10-31 (×12): qty 2

## 2022-10-31 NOTE — Progress Notes (Signed)
Patient transferred with all her belongings to 13. Report given to Sander Radon. Vitals stable upon transfer.

## 2022-10-31 NOTE — Progress Notes (Signed)
1 Day Post-Op       Procedure(s): XI ROBOTIC ASSISTED LAPAROSCOPIC OVARIAN CYSTECTOMY, DIAGNOSTIC LAPAROSCOPY (Left) XI ROBOTIC ASSISTED SALPINGO OOPHORECTOMY (Left) Subjective: The patient is still having the same amount of pain in the same area.  Objective: Vital signs in last 24 hours: Temp:  [97.1 F (36.2 C)-99 F (37.2 C)] 99 F (37.2 C) (08/30 0758) Pulse Rate:  [47-102] 68 (08/30 0758) Resp:  [8-20] 19 (08/30 0758) BP: (76-104)/(39-72) 95/66 (08/30 0758) SpO2:  [95 %-100 %] 100 % (08/30 0758) Weight:  [54.4 kg-55.3 kg] 54.4 kg (08/29 1607)  Intake/Output  Intake/Output Summary (Last 24 hours) at 10/31/2022 0902 Last data filed at 10/31/2022 0515 Gross per 24 hour  Intake 2620 ml  Output 580 ml  Net 2040 ml    Physical Exam:  General: Alert and oriented. CV: RRR Lungs: Clear bilaterally. GI: Soft, Nondistended. Incisions: Clean and dry. Urine: Clear Extremities: Nontender, no erythema, no edema.  Lab Results: Recent Labs    10/30/22 1055 10/30/22 2200 10/31/22 0053  HGB 11.4* 11.5* 9.3*  HCT 35.0* 35.4* 27.3*  WBC 4.2 14.7* 7.5  PLT 193 162 146*                 Results for orders placed or performed during the hospital encounter of 10/30/22 (from the past 24 hour(s))  Lipase, blood     Status: None   Collection Time: 10/30/22 10:55 AM  Result Value Ref Range   Lipase 24 11 - 51 U/L  Comprehensive metabolic panel     Status: None   Collection Time: 10/30/22 10:55 AM  Result Value Ref Range   Sodium 139 135 - 145 mmol/L   Potassium 3.9 3.5 - 5.1 mmol/L   Chloride 107 98 - 111 mmol/L   CO2 25 22 - 32 mmol/L   Glucose, Bld 96 70 - 99 mg/dL   BUN 12 6 - 20 mg/dL   Creatinine, Ser 3.87 0.44 - 1.00 mg/dL   Calcium 8.9 8.9 - 56.4 mg/dL   Total Protein 6.8 6.5 - 8.1 g/dL   Albumin 4.2 3.5 - 5.0 g/dL   AST 15 15 - 41 U/L   ALT 14 0 - 44 U/L   Alkaline Phosphatase 40 38 - 126 U/L   Total Bilirubin 0.7 0.3 - 1.2 mg/dL   GFR, Estimated >33 >29 mL/min    Anion gap 7 5 - 15  CBC     Status: Abnormal   Collection Time: 10/30/22 10:55 AM  Result Value Ref Range   WBC 4.2 4.0 - 10.5 K/uL   RBC 3.81 (L) 3.87 - 5.11 MIL/uL   Hemoglobin 11.4 (L) 12.0 - 15.0 g/dL   HCT 51.8 (L) 84.1 - 66.0 %   MCV 91.9 80.0 - 100.0 fL   MCH 29.9 26.0 - 34.0 pg   MCHC 32.6 30.0 - 36.0 g/dL   RDW 63.0 16.0 - 10.9 %   Platelets 193 150 - 400 K/uL   nRBC 0.0 0.0 - 0.2 %  Urinalysis, Routine w reflex microscopic -Urine, Random     Status: Abnormal   Collection Time: 10/30/22 10:55 AM  Result Value Ref Range   Color, Urine YELLOW (A) YELLOW   APPearance HAZY (A) CLEAR   Specific Gravity, Urine 1.010 1.005 - 1.030   pH 7.0 5.0 - 8.0   Glucose, UA NEGATIVE NEGATIVE mg/dL   Hgb urine dipstick SMALL (A) NEGATIVE   Bilirubin Urine NEGATIVE NEGATIVE   Ketones, ur NEGATIVE NEGATIVE mg/dL  Protein, ur NEGATIVE NEGATIVE mg/dL   Nitrite POSITIVE (A) NEGATIVE   Leukocytes,Ua NEGATIVE NEGATIVE   RBC / HPF 0-5 0 - 5 RBC/hpf   WBC, UA 0-5 0 - 5 WBC/hpf   Bacteria, UA FEW (A) NONE SEEN   Squamous Epithelial / HPF 0-5 0 - 5 /HPF   Mucus PRESENT   POC urine preg, ED     Status: None   Collection Time: 10/30/22 11:05 AM  Result Value Ref Range   Preg Test, Ur NEGATIVE NEGATIVE  CBC     Status: Abnormal   Collection Time: 10/30/22 10:00 PM  Result Value Ref Range   WBC 14.7 (H) 4.0 - 10.5 K/uL   RBC 3.83 (L) 3.87 - 5.11 MIL/uL   Hemoglobin 11.5 (L) 12.0 - 15.0 g/dL   HCT 29.5 (L) 62.1 - 30.8 %   MCV 92.4 80.0 - 100.0 fL   MCH 30.0 26.0 - 34.0 pg   MCHC 32.5 30.0 - 36.0 g/dL   RDW 65.7 84.6 - 96.2 %   Platelets 162 150 - 400 K/uL   nRBC 0.0 0.0 - 0.2 %  Basic metabolic panel     Status: Abnormal   Collection Time: 10/30/22 10:00 PM  Result Value Ref Range   Sodium 135 135 - 145 mmol/L   Potassium 4.0 3.5 - 5.1 mmol/L   Chloride 105 98 - 111 mmol/L   CO2 19 (L) 22 - 32 mmol/L   Glucose, Bld 99 70 - 99 mg/dL   BUN 8 6 - 20 mg/dL   Creatinine, Ser 9.52 0.44  - 1.00 mg/dL   Calcium 8.7 (L) 8.9 - 10.3 mg/dL   GFR, Estimated >84 >13 mL/min   Anion gap 11 5 - 15  CBC     Status: Abnormal   Collection Time: 10/31/22 12:53 AM  Result Value Ref Range   WBC 7.5 4.0 - 10.5 K/uL   RBC 3.04 (L) 3.87 - 5.11 MIL/uL   Hemoglobin 9.3 (L) 12.0 - 15.0 g/dL   HCT 24.4 (L) 01.0 - 27.2 %   MCV 89.8 80.0 - 100.0 fL   MCH 30.6 26.0 - 34.0 pg   MCHC 34.1 30.0 - 36.0 g/dL   RDW 53.6 64.4 - 03.4 %   Platelets 146 (L) 150 - 400 K/uL   nRBC 0.0 0.0 - 0.2 %  Basic metabolic panel     Status: Abnormal   Collection Time: 10/31/22 12:53 AM  Result Value Ref Range   Sodium 136 135 - 145 mmol/L   Potassium 3.6 3.5 - 5.1 mmol/L   Chloride 106 98 - 111 mmol/L   CO2 22 22 - 32 mmol/L   Glucose, Bld 136 (H) 70 - 99 mg/dL   BUN 8 6 - 20 mg/dL   Creatinine, Ser 7.42 0.44 - 1.00 mg/dL   Calcium 8.0 (L) 8.9 - 10.3 mg/dL   GFR, Estimated >59 >56 mL/min   Anion gap 8 5 - 15    Assessment/Plan: 1 Day Post-Op       Procedure(s): XI ROBOTIC ASSISTED LAPAROSCOPIC OVARIAN CYSTECTOMY, DIAGNOSTIC LAPAROSCOPY (Left) XI ROBOTIC ASSISTED SALPINGO OOPHORECTOMY (Left)  Given amount of hemoperitoneum in abdomen I evacuated I expected her to improve today. As her pain is unchanged in the same area, I will proceed with CT abd pelvis for other indications.  She had a deep allen masters in the left pelvis that I did not remove. Urination is appropriate.  Will do ERAS for now and monitor.  POTS: adequate hydration, salt  -Ambulate, Incentive spirometry -Advance diet as tolerated    Christeen Douglas, MD   LOS: 0 days   Christeen Douglas 10/31/2022, 9:02 AM

## 2022-10-31 NOTE — Progress Notes (Signed)
Enema ordered for pain and constipation. Plan to move to a regular med-surg unit as newborn noises causing emotional distress.

## 2022-10-31 NOTE — Consult Note (Signed)
[facility] SURGICAL CONSULTATION NOTE (initial) - cpt: 09811   HISTORY OF PRESENT ILLNESS (HPI):  32 y.o. female presented to [facility] ED on three separate occasions in the last 8 days for abdominal pain. Patient has been reporting around a 2 week history of lower abdominal pain particularly on the left. She had undergone pelvic US on 08/22 and 08/24 and found to have possible hemorrhagic cyst although was small in size on 08/24. She presented again yesterday afternoon secondary to this LLQ abdominal pain. She reports this is typically crampy and sharp in nature. Nothing specific triggers it. At the worst, she will get diaphoretic, nauseous, and occasionally omit with these episodes. She does report history of POTS and issues with digestion, needing Zofran pre-prandially. Her normal bowel habits are every 2-3 days. No reports of frank blood. During this recent presentation, she underwent a third pelvic US which again was concerning for hemorrhagic cyst. Gynecology was consulted and she underwent diagnostic laparoscopy with Dr Dalbert Garnet which showed hemorrhagic ovarian cyst and hemoperitoneum. She ultimately required left salpingo-oophorectomy. This morning, she continued to have similar, LLQ abdominal pain. She feels this still remains fairly similar to before surgery. Most recent labs show a normal WBC at 7.5K (from 14.7K), Hgb to 9.3 (from 11.5), renal function remains normal with sCr - 0.61; UO - 450 ccs + unmeasured, no electrolyte derangements. She has been on regular diet; tolerating but decreased appetite. She is passing flatus.   Surgery is consulted by OB/GYN physician Dr. Christeen Douglas, MD in this context for evaluation and management of intractable LLQ pain.   PAST MEDICAL HISTORY (PMH):  Past Medical History:  Diagnosis Date   Ovarian cyst    POTS (postural orthostatic tachycardia syndrome)    PTSD (post-traumatic stress disorder)    Seizure (HCC)      PAST SURGICAL  HISTORY (PSH):  Past Surgical History:  Procedure Laterality Date   CHOLECYSTECTOMY     INGUINAL HERNIA REPAIR Bilateral    LAPAROSCOPIC APPENDECTOMY N/A 10/23/2020   Procedure: APPENDECTOMY LAPAROSCOPIC;  Surgeon: Leafy Ro, MD;  Location: [facility] ORS;  Service: General;  Laterality: N/A;   ROBOTIC ASSISTED LAPAROSCOPIC OVARIAN CYSTECTOMY Left 10/30/2022   Procedure: XI ROBOTIC ASSISTED LAPAROSCOPIC OVARIAN CYSTECTOMY, DIAGNOSTIC LAPAROSCOPY;  Surgeon: Christeen Douglas, MD;  Location: [facility] ORS;  Service: Gynecology;  Laterality: Left;   ROBOTIC ASSISTED SALPINGO OOPHERECTOMY Left 10/30/2022   Procedure: XI ROBOTIC ASSISTED SALPINGO OOPHORECTOMY;  Surgeon: Christeen Douglas, MD;  Location: [facility] ORS;  Service: Gynecology;  Laterality: Left;     MEDICATIONS:  Prior to Admission medications   Medication Sig Start Date End Date Taking? Authorizing Provider  cephALEXin (KEFLEX) 500 MG capsule Take 1 capsule (500 mg total) by mouth 3 (three) times daily. 12/27/20   Minna Antis, MD  ibuprofen (ADVIL) 600 MG tablet Take 1 tablet (600 mg total) by mouth every 6 (six) hours as needed. 10/24/20   Donovan Kail, PA-C  ondansetron (ZOFRAN-ODT) 8 MG disintegrating tablet Take 1 tablet (8 mg total) by mouth every 8 (eight) hours as needed for nausea or vomiting. 10/24/20   Donovan Kail, PA-C  oxyCODONE (OXY IR/ROXICODONE) 5 MG immediate release tablet Take 1 tablet (5 mg total) by mouth every 4 (four) hours as needed for severe pain or breakthrough pain. 10/24/20   Donovan Kail, PA-C  oxyCODONE-acetaminophen (PERCOCET) 5-325 MG tablet Take 1 tablet by mouth every 8 (eight) hours as needed for severe pain. 10/25/22 10/25/23  Jene Every, MD  valACYclovir (  VALTREX) 500 MG tablet Take 500 mg by mouth 2 (two) times daily as needed.    [provider]     ALLERGIES:  Allergies  Allergen Reactions   Bactrim [Sulfamethoxazole-Trimethoprim] Other (See Comments)    seizure   Sulfa  Antibiotics     seizures     SOCIAL HISTORY:  Social History   Socioeconomic History   Marital status: Single    Spouse name: Not on file   Number of children: Not on file   Years of education: Not on file   Highest education level: Not on file  Occupational History   Not on file  Tobacco Use   Smoking status: Never   Smokeless tobacco: Never  Substance and Sexual Activity   Alcohol use: Not on file   Drug use: Not on file   Sexual activity: Not on file  Other Topics Concern   Not on file  Social History Narrative   Not on file   Social Determinants of Health   Financial Resource Strain: Not on file  Food Insecurity: Not on file  Transportation Needs: Not on file  Physical Activity: Not on file  Stress: Not on file  Social Connections: Not on file  Intimate Partner Violence: Not on file     FAMILY HISTORY:  History reviewed. No pertinent family history.    REVIEW OF SYSTEMS:  Review of Systems  Constitutional:  Negative for chills and fever.  Respiratory:  Negative for cough and shortness of breath.   Cardiovascular:  Negative for chest pain and palpitations.  Gastrointestinal:  Positive for abdominal pain. Negative for diarrhea, nausea and vomiting.  All other systems reviewed and are negative.   VITAL SIGNS:  Temp:  [97.1 F (36.2 C)-99 F (37.2 C)] 97.9 F (36.6 C) (08/30 1117) Pulse Rate:  [47-102] 65 (08/30 1304) Resp:  [8-20] 18 (08/30 1117) BP: (76-104)/(39-72) 100/61 (08/30 1304) SpO2:  [95 %-100 %] 100 % (08/30 0758) Weight:  [54.4 kg-55.3 kg] 54.4 kg (08/29 1607)     Height: 5\' 9"  (175.3 cm) Weight: 54.4 kg BMI (Calculated): 17.71   INTAKE/OUTPUT:  08/29 0701 - 08/30 0700 In: 2620 [P.O.:120; I.V.:2500] Out: 580 [Urine:450; Blood:130]  PHYSICAL EXAM:  Physical Exam Vitals and nursing note reviewed. Exam conducted with a chaperone present.  Constitutional:      General: She is not in acute distress.    Appearance: She is well-developed  and normal weight. She is not ill-appearing.     Comments: Patient resting in bed; NAD  HENT:     Head: Normocephalic and atraumatic.  Eyes:     Extraocular Movements: Extraocular movements intact.  Cardiovascular:     Rate and Rhythm: Normal rate.     Heart sounds: Normal heart sounds. No murmur heard. Pulmonary:     Effort: Pulmonary effort is normal. No respiratory distress.  Abdominal:     General: Abdomen is flat. A surgical scar is present. There is no distension.     Palpations: Abdomen is soft.     Tenderness: There is abdominal tenderness in the left lower quadrant. There is no guarding or rebound.     Comments: Abdomen is soft, non-distended, incisional soreness as well as localized LLQ tenderness, no rebound/guarding, she does not appear peritonitic  Genitourinary:    Comments: Deferred Skin:    General: Skin is warm and dry.     Coloration: Skin is not jaundiced or pale.     Findings: No erythema.  Neurological:  Mental Status: She is alert.      Labs:     Latest Ref Rng & Units 10/31/2022   12:53 AM 10/30/2022   10:00 PM 10/30/2022   10:55 AM  CBC  WBC 4.0 - 10.5 K/uL 7.5  14.7  4.2   Hemoglobin 12.0 - 15.0 g/dL 9.3  16.1  09.6   Hematocrit 36.0 - 46.0 % 27.3  35.4  35.0   Platelets 150 - 400 K/uL 146  162  193       Latest Ref Rng & Units 10/31/2022   12:53 AM 10/30/2022   10:00 PM 10/30/2022   10:55 AM  CMP  Glucose 70 - 99 mg/dL 045  99  96   BUN 6 - 20 mg/dL 8  8  12    Creatinine 0.44 - 1.00 mg/dL 4.09  8.11  9.14   Sodium 135 - 145 mmol/L 136  135  139   Potassium 3.5 - 5.1 mmol/L 3.6  4.0  3.9   Chloride 98 - 111 mmol/L 106  105  107   CO2 22 - 32 mmol/L 22  19  25    Calcium 8.9 - 10.3 mg/dL 8.0  8.7  8.9   Total Protein 6.5 - 8.1 g/dL   6.8   Total Bilirubin 0.3 - 1.2 mg/dL   0.7   Alkaline Phos 38 - 126 U/L   40   AST 15 - 41 U/L   15   ALT 0 - 44 U/L   14      Imaging studies:   CT Abdomen/Pelvis (10/31/2022) personally reviewed which  does show pneumoperitoneum but not unexpected given surgery in last 24 hours, no evidence of continued/residual hemoperitoneum, there is no evidence of obstruction, there is stool burden throughout colon and slight redundancy to sigmoid, no evidence of diverticulosis, and radiologist report pending...   Assessment/Plan: (ICD-10's: R10.32) 32 y.o. female with continued LLQ abdominal pain 1 day s/p diagnostic laparoscopic, left ovarian cystectomy, and left salpingo-oophorectomy for hemorrhagic left ovarian cyst and hemoperitoneum   - She continues to have LLQ abdominal pain despite addressing gynecologic source operatively yesterday. Fortunately, her laboratory work up and recent CT are reassuring. There is of course pneumoperitoneum present on CT today but this is certainly secondary to surgery yesterday. There is no evidence of residual hemoperitoneum. No evidence of obstruction. She does hae stool burden throughout her colon. No evidence of diverticulitis. At this time, there is nothing to offer from general surgery perspective.  - I do think she will benefit from added Miralax BID to colace for bowel regimen as constipation could certainly be playing a factor in her discomfort.  - Continue post-operative analgesic and antiemetics prn.  - Monitor abdominal examination. - Further management per primary service. We will certainly remain available should situation change.   All of the above findings and recommendations were discussed with the patient, and all of patient's questions were answered to her expressed satisfaction.  Thank you for the opportunity to participate in this patient's care.   -- Lynden Oxford, PA-C [facility] 10/31/2022, 2:24 PM M-F: 7am - 4pm

## 2022-11-01 DIAGNOSIS — K661 Hemoperitoneum: Secondary | ICD-10-CM | POA: Diagnosis present

## 2022-11-01 DIAGNOSIS — R1310 Dysphagia, unspecified: Secondary | ICD-10-CM | POA: Diagnosis not present

## 2022-11-01 DIAGNOSIS — R1013 Epigastric pain: Secondary | ICD-10-CM | POA: Diagnosis not present

## 2022-11-01 DIAGNOSIS — G90A Postural orthostatic tachycardia syndrome (POTS): Secondary | ICD-10-CM | POA: Diagnosis present

## 2022-11-01 DIAGNOSIS — K200 Eosinophilic esophagitis: Secondary | ICD-10-CM | POA: Diagnosis present

## 2022-11-01 DIAGNOSIS — K2950 Unspecified chronic gastritis without bleeding: Secondary | ICD-10-CM | POA: Diagnosis present

## 2022-11-01 DIAGNOSIS — N920 Excessive and frequent menstruation with regular cycle: Secondary | ICD-10-CM | POA: Diagnosis not present

## 2022-11-01 DIAGNOSIS — B9620 Unspecified Escherichia coli [E. coli] as the cause of diseases classified elsewhere: Secondary | ICD-10-CM | POA: Diagnosis not present

## 2022-11-01 DIAGNOSIS — N390 Urinary tract infection, site not specified: Secondary | ICD-10-CM | POA: Diagnosis not present

## 2022-11-01 DIAGNOSIS — N83202 Unspecified ovarian cyst, left side: Secondary | ICD-10-CM | POA: Diagnosis present

## 2022-11-01 DIAGNOSIS — R109 Unspecified abdominal pain: Secondary | ICD-10-CM | POA: Insufficient documentation

## 2022-11-01 DIAGNOSIS — Z883 Allergy status to other anti-infective agents status: Secondary | ICD-10-CM | POA: Diagnosis not present

## 2022-11-01 DIAGNOSIS — Z8041 Family history of malignant neoplasm of ovary: Secondary | ICD-10-CM | POA: Diagnosis not present

## 2022-11-01 DIAGNOSIS — G40909 Epilepsy, unspecified, not intractable, without status epilepticus: Secondary | ICD-10-CM | POA: Diagnosis present

## 2022-11-01 DIAGNOSIS — K449 Diaphragmatic hernia without obstruction or gangrene: Secondary | ICD-10-CM | POA: Diagnosis present

## 2022-11-01 DIAGNOSIS — N83209 Unspecified ovarian cyst, unspecified side: Secondary | ICD-10-CM | POA: Diagnosis not present

## 2022-11-01 DIAGNOSIS — K2970 Gastritis, unspecified, without bleeding: Secondary | ICD-10-CM | POA: Diagnosis not present

## 2022-11-01 DIAGNOSIS — K219 Gastro-esophageal reflux disease without esophagitis: Secondary | ICD-10-CM | POA: Diagnosis present

## 2022-11-01 DIAGNOSIS — E538 Deficiency of other specified B group vitamins: Secondary | ICD-10-CM | POA: Diagnosis present

## 2022-11-01 DIAGNOSIS — D696 Thrombocytopenia, unspecified: Secondary | ICD-10-CM | POA: Diagnosis present

## 2022-11-01 DIAGNOSIS — F4310 Post-traumatic stress disorder, unspecified: Secondary | ICD-10-CM | POA: Diagnosis present

## 2022-11-01 DIAGNOSIS — K5900 Constipation, unspecified: Secondary | ICD-10-CM | POA: Diagnosis not present

## 2022-11-01 DIAGNOSIS — Z882 Allergy status to sulfonamides status: Secondary | ICD-10-CM | POA: Diagnosis not present

## 2022-11-01 DIAGNOSIS — Z79899 Other long term (current) drug therapy: Secondary | ICD-10-CM | POA: Diagnosis not present

## 2022-11-01 DIAGNOSIS — Z9079 Acquired absence of other genital organ(s): Secondary | ICD-10-CM | POA: Diagnosis not present

## 2022-11-01 DIAGNOSIS — N838 Other noninflammatory disorders of ovary, fallopian tube and broad ligament: Secondary | ICD-10-CM | POA: Diagnosis present

## 2022-11-01 DIAGNOSIS — D62 Acute posthemorrhagic anemia: Secondary | ICD-10-CM | POA: Diagnosis not present

## 2022-11-01 LAB — URINALYSIS, ROUTINE W REFLEX MICROSCOPIC
Bilirubin Urine: NEGATIVE
Glucose, UA: NEGATIVE mg/dL
Hgb urine dipstick: NEGATIVE
Ketones, ur: NEGATIVE mg/dL
Nitrite: NEGATIVE
Protein, ur: NEGATIVE mg/dL
Specific Gravity, Urine: 1.015 (ref 1.005–1.030)
pH: 5.5 (ref 5.0–8.0)

## 2022-11-01 MED ORDER — ALUM & MAG HYDROXIDE-SIMETH 200-200-20 MG/5ML PO SUSP
30.0000 mL | Freq: Once | ORAL | Status: AC
  Administered 2022-11-01: 30 mL via ORAL
  Filled 2022-11-01: qty 30

## 2022-11-01 MED ORDER — CLONAZEPAM 0.25 MG PO TBDP: 1.0000 mg | ORAL_TABLET | Freq: Two times a day (BID) | ORAL | Status: DC | PRN

## 2022-11-01 MED ORDER — CLONAZEPAM 1 MG PO TABS
1.0000 mg | ORAL_TABLET | Freq: Two times a day (BID) | ORAL | Status: DC | PRN
  Administered 2022-11-01 – 2022-11-05 (×7): 1 mg via ORAL
  Filled 2022-11-01 (×8): qty 1

## 2022-11-01 MED ORDER — PANTOPRAZOLE SODIUM 40 MG IV SOLR
40.0000 mg | Freq: Two times a day (BID) | INTRAVENOUS | Status: DC
  Administered 2022-11-01 – 2022-11-04 (×7): 40 mg via INTRAVENOUS
  Filled 2022-11-01 (×7): qty 10

## 2022-11-01 NOTE — Assessment & Plan Note (Signed)
Was reconsulted in the setting of epigastric pain as well as lower abdominal pain with new onset pelvic cramping and heavy menstrual bleeding postoperatively for hemorrhagic ovarian cyst Case discussed at length with Dr. Timothy Lasso Will continue IV PPI treatment Avoid NSAIDs No overt hematemesis, coffee-ground emesis, black or bloody stools Dr. Timothy Lasso to formally consult in the morning Patient does appear to have a significant amount of pelvic pain with heavy menstrual bleeding Will need to be evaluated from a GYN standpoint especially given recent oophorectomy for hemorrhagic ovarian cyst. There also appears to be an element of possible functional abdominal pain which may be confounding issue Avoidance of NSAIDs in the setting of gastritis and active menstrual pain may also exacerbate pain profile Plan of care discussed at length with Dr. Dalbert Garnet

## 2022-11-01 NOTE — Plan of Care (Signed)
  Problem: Education: Goal: Knowledge of the prescribed therapeutic regimen will improve Outcome: Progressing   Problem: Self-Concept: Goal: Communication of feelings regarding changes in body function or appearance will improve Outcome: Progressing   Problem: Skin Integrity: Goal: Demonstration of wound healing without infection will improve Outcome: Progressing   Problem: Education: Goal: Knowledge of General Education information will improve Description: Including pain rating scale, medication(s)/side effects and non-pharmacologic comfort measures Outcome: Progressing   Problem: Activity: Goal: Risk for activity intolerance will decrease Outcome: Progressing   Problem: Nutrition: Goal: Adequate nutrition will be maintained Outcome: Progressing   Problem: Elimination: Goal: Will not experience complications related to bowel motility Outcome: Progressing Goal: Will not experience complications related to urinary retention Outcome: Progressing   Problem: Pain Managment: Goal: General experience of comfort will improve Outcome: Progressing   Problem: Safety: Goal: Ability to remain free from injury will improve Outcome: Progressing   Problem: Skin Integrity: Goal: Risk for impaired skin integrity will decrease Outcome: Progressing

## 2022-11-01 NOTE — Evaluation (Signed)
Physical Therapy Evaluation Patient Details Name: Amy Salas MRN: [id number] DOB: 1990/12/29 Today's Date: 11/01/2022  History of Present Illness  Pt admitted to [facility] 10/30/22 under observation for c/o abdominal pain for several days, without relief from medication. S/p diagnostic laparoscopic, left ovarian cystectomy, and left salpingo-oophorectomy for hemorrhagic left ovarian cyst and hemoperitoneumon on 8/29. Significant PMH includes: seizure, POTS, and PTSD.   Clinical Impression  Pt is a 32 year old F admitted to hospital on 10/30/22 for hemorrhagic ovarian cyst. At baseline, pt being completely IND with ADL's, IADL's, ambulation without AD, driving, medication management, and working full time.   Pt presents with increased pain levels from surgical intervention which have resulted in: decreased standing balance, functional weakness, decreased activity tolerance, and gait deficits. Due to deficits, pt required mod I for bed mobility, min assist for transfers with L HHA, and CGA-supervision to ambulate a total of 53ft with intermittent UE support from IV pole for steadying. Further mobility limited due to increased c/o pain with prolonged upright mobility. RN notified of request for pain medication.  Deficits limit the pt's ability to safely and independently perform ADL's, transfer, and ambulate. Pt will benefit from acute skilled PT services to address deficits for return to baseline function. She will also benefit from daily walks with mobility specialist vs. Nursing, for continued progress towards goals and safe DC home. Deficits expected to improve with continued participation with mobility specialist and acute PT services. Therefore, no post acute PT needs identified at this time. However, will recommend 3:1 for safety with ADL's at DC. Pt agreeable.        If plan is discharge home, recommend the following: A little help with walking and/or transfers;A little help with  bathing/dressing/bathroom;Assistance with cooking/housework;Assist for transportation;Help with stairs or ramp for entrance     Equipment Recommendations BSC/3in1     Functional Status Assessment Patient has had a recent decline in their functional status and demonstrates the ability to make significant improvements in function in a reasonable and predictable amount of time.     Precautions / Restrictions Precautions Precautions: Fall      Mobility  Bed Mobility Overal bed mobility: Modified Independent             General bed mobility comments: for supine<>sit transfers with pillow at abdomen for splinting; increased time/effort    Transfers Overall transfer level: Needs assistance Equipment used: 1 person hand held assist Transfers: Sit to/from Stand Sit to Stand: Min assist           General transfer comment: min assist for power to stand via L HHA for EOB and toilet transfers; limited with independence due to increased abdominal pain    Ambulation/Gait Ambulation/Gait assistance: Contact guard assist, Supervision Gait Distance (Feet): 90 Feet Assistive device: IV Pole, None         General Gait Details: Initially CGA to ambulate with UUE support on IV pole and pillow for splinting. Demonstrates slowed cadence with guarded movement and early reciprocal gait. Able to progress to no UE support and supervision for safety.      Balance Overall balance assessment: Needs assistance Sitting-balance support: Feet supported Sitting balance-Leahy Scale: Good     Standing balance support: Single extremity supported, No upper extremity supported, During functional activity Standing balance-Leahy Scale: Fair                               Pertinent  Vitals/Pain Pain Assessment Pain Assessment: 0-10 Pain Score: 3  Pain Location: incisional Pain Descriptors / Indicators: Dull, Aching Pain Intervention(s): Limited activity within patient's tolerance,  Monitored during session, Patient requesting pain meds-RN notified    Home Living Family/patient expects to be discharged to:: Private residence Living Arrangements: Spouse/significant other;Children (6yo son) Available Help at Discharge: Family;Available PRN/intermittently (son will be in school M-F and has an afterschool program. Pt states her boyfriend has his own business and might be able to provide more than PRN care) Type of Home: House Home Access: Stairs to enter Entrance Stairs-Rails: None Entrance Stairs-Number of Steps: 1 (small)     Home Equipment: Rollator (4 wheels) Additional Comments: recliner    Prior Function Prior Level of Function : Independent/Modified Independent;Working/employed;Driving             Mobility Comments: IND with ambulation without AD. ADLs Comments: IND with ADL's, IADL's, and medication management.     Extremity/Trunk Assessment   Upper Extremity Assessment Upper Extremity Assessment: Overall WFL for tasks assessed (not formally assessed due to acuity of sx and c/o abdominal pain; observed to be at least 3+/5 as pt was able to WB through UE for transfers without buckling)    Lower Extremity Assessment Lower Extremity Assessment: Overall WFL for tasks assessed;LLE deficits/detail (not formally assessed due to acuity of sx and c/o abdominal pain; observed to be at least 3+/5 as pt was able to WB through LE for transfers and gait without buckling) LLE Deficits / Details: diminished sensation along L lateral femoral cutaneous nerve at baseline       Communication   Communication Communication: No apparent difficulties Cueing Techniques: Verbal cues  Cognition Arousal: Alert Behavior During Therapy: WFL for tasks assessed/performed Overall Cognitive Status: Within Functional Limits for tasks assessed                                             Exercises Other Exercises Other Exercises: Participates in bed mobility,  tranfers, gait, and toileting. Able to improve assist levels and balance with continued ambulation/mobility. Other Exercises: Pt educated re: PT role/POC, DC recommendations, sleeping in recliner at home for comfort, pain management techniques (log roll technique, splinting, pursed lip breathing, generalized movement), mobility helping with gas pains, and activity pacing/tolerance. she verbalized understanding.   Assessment/Plan    PT Assessment Patient needs continued PT services  PT Problem List Decreased strength;Decreased activity tolerance;Decreased balance;Decreased mobility;Impaired sensation;Pain       PT Treatment Interventions DME instruction;Gait training;Stair training;Functional mobility training;Therapeutic activities;Therapeutic exercise;Balance training;Neuromuscular re-education;Patient/family education    PT Goals (Current goals can be found in the Care Plan section)  Acute Rehab PT Goals Patient Stated Goal: "go home" PT Goal Formulation: With patient Time For Goal Achievement: 11/15/22 Potential to Achieve Goals: Good    Frequency Min 1X/week        AM-PAC PT "6 Clicks" Mobility  Outcome Measure Help needed turning from your back to your side while in a flat bed without using bedrails?: None Help needed moving from lying on your back to sitting on the side of a flat bed without using bedrails?: None Help needed moving to and from a bed to a chair (including a wheelchair)?: A Little Help needed standing up from a chair using your arms (e.g., wheelchair or bedside chair)?: A Little Help needed to walk in hospital room?: A Little  Help needed climbing 3-5 steps with a railing? : A Little 6 Click Score: 20    End of Session Equipment Utilized During Treatment: Gait belt Activity Tolerance: Patient tolerated treatment well;Patient limited by pain Patient left: in bed;with call bell/phone within reach Nurse Communication: Mobility status;Patient requests pain  meds PT Visit Diagnosis: Unsteadiness on feet (R26.81);Muscle weakness (generalized) (M62.81);Pain Pain - part of body:  (abdomen)    Time: 1610-9604 PT Time Calculation (min) (ACUTE ONLY): 23 min   Charges:   PT Evaluation $PT Eval Low Complexity: 1 Low PT Treatments $Therapeutic Activity: 8-22 mins PT General Charges $$ ACUTE PT VISIT: 1 Visit        Vira Blanco, PT, DPT 3:33 PM,11/01/22 Physical Therapist - [facility]

## 2022-11-01 NOTE — Plan of Care (Signed)

## 2022-11-01 NOTE — Consult Note (Addendum)
Initial Consultation Note   Patient: Amy Salas BMW:[id number] DOB: 03-24-1990 PCP: Pcp, No DOA: 10/30/2022 DOS: the patient was seen and examined on 11/01/2022 Primary service: Christeen Douglas, MD  Referring physician: Christeen Douglas, MD Reason for consult: Abdominal pain   Assessment/Plan: Assessment and Plan: Abdominal pain Patient reports recurrent abdominal pain over the past several weeks with noted recent evaluation for hemorrhagic ovarian cyst status post removal. Follow-up imaging today on CT scan otherwise grossly stable. General surgery consulted will otherwise no additional workup Does appear to have a fair amount of epigastric tenderness Also with noted history of hiatal hernia not repaired as well as history of esophageal stricture status post dilatation Suspect element of gastritis as contribution to abdominal pain on overall presentation Positive high-dose NSAID use given noted pelvic pain over several weeks Will start on IV PPI Trial of GI cocktail Avoid NSAIDs were possible May benefit from outpatient GI evaluation at discharge       TRH will sign off at present, please call us again when needed.  HPI: Amy Salas is a 32 y.o. female with past medical history of ovarian cyst, POTS syndrome, PTSD seizure disorder presenting to the hospital with recurrent lower abdominal pain with noted hemorrhagic ovarian cyst.  Patient noted to have had operative repair by GYN on 8/29.  Per patient, she has had some fairly persistent lower as well as upper abdominal pain.  Patient states she has had relatively 2 2 months of persistent abdominal pain with decreased p.o. intake.  Has been taking high-dose NSAIDs in the setting of menstrual pain.  No noted baseline history of appendectomy, cholecystectomy.  Also history of hiatal hernia no repair.  Does report history of esophageal stricture status post dilatation.  No fevers or chills.  Has had some  weight loss over this timeframe.  No night sweats.  Denies any reported alcohol or tobacco use.  Still with mild to moderate pain after operative repair.  Repeat CT of the abdomen pelvis done today that was grossly stable.  General surgery also consulted with no plan for intervention. Currently afebrile, hemodynamically stable.  White count 7.5, hemoglobin 9.3.  Creatinine 0.61.  CT abdomen pelvis today with stable postoperative changes.   Review of Systems: As mentioned in the history of present illness. All other systems reviewed and are negative. Past Medical History:  Diagnosis Date   Ovarian cyst    POTS (postural orthostatic tachycardia syndrome)    PTSD (post-traumatic stress disorder)    Seizure (HCC)    Past Surgical History:  Procedure Laterality Date   CHOLECYSTECTOMY     INGUINAL HERNIA REPAIR Bilateral    LAPAROSCOPIC APPENDECTOMY N/A 10/23/2020   Procedure: APPENDECTOMY LAPAROSCOPIC;  Surgeon: Leafy Ro, MD;  Location: [facility] ORS;  Service: General;  Laterality: N/A;   ROBOTIC ASSISTED LAPAROSCOPIC OVARIAN CYSTECTOMY Left 10/30/2022   Procedure: XI ROBOTIC ASSISTED LAPAROSCOPIC OVARIAN CYSTECTOMY, DIAGNOSTIC LAPAROSCOPY;  Surgeon: Christeen Douglas, MD;  Location: [facility] ORS;  Service: Gynecology;  Laterality: Left;   ROBOTIC ASSISTED SALPINGO OOPHERECTOMY Left 10/30/2022   Procedure: XI ROBOTIC ASSISTED SALPINGO OOPHORECTOMY;  Surgeon: Christeen Douglas, MD;  Location: [facility] ORS;  Service: Gynecology;  Laterality: Left;   Social History:  reports that she has never smoked. She has never used smokeless tobacco. No history on file for alcohol use and drug use.  Allergies  Allergen Reactions   Bactrim [Sulfamethoxazole-Trimethoprim] Other (See Comments)    seizure   Sulfa Antibiotics     seizures  History reviewed. No pertinent family history.  Prior to Admission medications   Medication Sig Start Date End Date Taking? Authorizing Provider  cephALEXin (KEFLEX) 500 MG  capsule Take 1 capsule (500 mg total) by mouth 3 (three) times daily. 12/27/20   Minna Antis, MD  ibuprofen (ADVIL) 600 MG tablet Take 1 tablet (600 mg total) by mouth every 6 (six) hours as needed. 10/24/20   Donovan Kail, PA-C  ondansetron (ZOFRAN-ODT) 8 MG disintegrating tablet Take 1 tablet (8 mg total) by mouth every 8 (eight) hours as needed for nausea or vomiting. 10/24/20   Donovan Kail, PA-C  oxyCODONE (OXY IR/ROXICODONE) 5 MG immediate release tablet Take 1 tablet (5 mg total) by mouth every 4 (four) hours as needed for severe pain or breakthrough pain. 10/24/20   Donovan Kail, PA-C  oxyCODONE-acetaminophen (PERCOCET) 5-325 MG tablet Take 1 tablet by mouth every 8 (eight) hours as needed for severe pain. 10/25/22 10/25/23  Jene Every, MD  valACYclovir (VALTREX) 500 MG tablet Take 500 mg by mouth 2 (two) times daily as needed.    [provider]    Physical Exam: Vitals:   10/31/22 1535 10/31/22 2042 10/31/22 2108 11/01/22 0853  BP: (!) 95/57 101/69 99/61 (!) 102/54  Pulse: 71 76 67 89  Resp: 18 16 20 18   Temp: 99 F (37.2 C) 98 F (36.7 C) 98 F (36.7 C) 98.2 F (36.8 C)  TempSrc: Oral Oral  Oral  SpO2:  100% 99% 100%  Weight:      Height:       Physical Exam Constitutional:      Appearance: She is normal weight.  HENT:     Head: Normocephalic.     Nose: Nose normal.  Cardiovascular:     Rate and Rhythm: Normal rate and regular rhythm.     Pulses: Normal pulses.  Pulmonary:     Effort: Pulmonary effort is normal.  Abdominal:     General: Abdomen is flat. Bowel sounds are normal.     Comments: + epigastric TTP    Musculoskeletal:        General: Normal range of motion.  Skin:    General: Skin is warm.  Neurological:     General: No focal deficit present.  Psychiatric:        Mood and Affect: Mood normal.    Greater than 50% was spent in counseling and coordination of care with patient Total encounter time 80 minutes or  more  Data Reviewed:   There are no new results to review at this time.   CT ABDOMEN PELVIS W CONTRAST CLINICAL DATA:  One day postop. Status post robotic assisted laparoscopic salpingo-oophorectomy. Patient complains of abdominal pain.  EXAM: CT ABDOMEN AND PELVIS WITH CONTRAST  TECHNIQUE: Multidetector CT imaging of the abdomen and pelvis was performed using the standard protocol following bolus administration of intravenous contrast.  RADIATION DOSE REDUCTION: This exam was performed according to the departmental dose-optimization program which includes automated exposure control, adjustment of the mA and/or kV according to patient size and/or use of iterative reconstruction technique.  CONTRAST:  75mL OMNIPAQUE IOHEXOL 300 MG/ML  SOLN  COMPARISON:  12/27/2020.  FINDINGS: Lower chest: Mild platelike atelectasis noted within the lung bases. No pleural fluid or consolidative change.  Hepatobiliary: No suspicious liver abnormality. The gallbladder is either decompressed or surgically absent. No bile duct dilatation.  Pancreas: Unremarkable. No pancreatic ductal dilatation or surrounding inflammatory changes.  Spleen: Normal in size without focal abnormality.  Adrenals/Urinary Tract: Normal adrenal glands. No nephrolithiasis or signs of obstructive uropathy. Too small to characterize right kidney cysts measure up to 6 mm compatible with Bosniak class 2 lesions. No follow-up imaging recommended. No suspicious bladder abnormality. Small amount of gas noted within the non dependent bladder lumen which likely reflects recent instrumentation.  Stomach/Bowel: Moderate distension the gastric lumen with enteric contrast material. The appendix is not visualized. Postsurgical change at the cecal base is favored to represent prior appendectomy. No bowel wall thickening, inflammation or distension.  Vascular/Lymphatic: No significant vascular findings are present. No enlarged  abdominal or pelvic lymph nodes.  Reproductive: The uterus appears normal. Postoperative changes within the pelvis compatible with salpingo-oophorectomy.  Other: There is a small volume of free fluid noted within the dependent portion of the pelvis, image 61/2. No focal fluid collections identified to suggest abscess. New scratch there is pneumoperitoneum identified. This is within normal limits for postop day 1 status post laparoscopic surgery.  The visualized osseous structures appear normal.  Musculoskeletal: No acute or significant osseous findings.  IMPRESSION: 1. Postoperative changes compatible with salpingo-oophorectomy from the previous day. 2. Small volume of free fluid noted within the dependent portion of the pelvis. Compatible with recent postoperative change. 3. No signs of abscess or bowel obstruction.  Electronically Signed   By: Signa Kell M.D.   On: 11/01/2022 10:06  Lab Results  Component Value Date   WBC 7.5 10/31/2022   HGB 9.3 (L) 10/31/2022   HCT 27.3 (L) 10/31/2022   MCV 89.8 10/31/2022   PLT 146 (L) 10/31/2022   Last metabolic panel Lab Results  Component Value Date   GLUCOSE 136 (H) 10/31/2022   NA 136 10/31/2022   K 3.6 10/31/2022   CL 106 10/31/2022   CO2 22 10/31/2022   BUN 8 10/31/2022   CREATININE 0.61 10/31/2022   GFRNONAA >60 10/31/2022   CALCIUM 8.0 (L) 10/31/2022   PROT 6.8 10/30/2022   ALBUMIN 4.2 10/30/2022   BILITOT 0.7 10/30/2022   ALKPHOS 40 10/30/2022   AST 15 10/30/2022   ALT 14 10/30/2022   ANIONGAP 8 10/31/2022      Family Communication: No family at the bedside  Primary team communication: Plan of care discussed w/ Dr. Dalbert Garnet  Thank you very much for involving [place] in the care of your patient.  Author: Floydene Flock, MD 11/01/2022 1:49 PM  For on call review [email or web address].

## 2022-11-01 NOTE — Progress Notes (Signed)
Obstetric and Gynecology  Subjective  Amy Salas is a 32 y.o. female G1P1001 who presented on 10/30/2022 for abdominal/pelvic pain localized to the left lower pelvis, found to have a ruptured hemorrhagic ovarian cyst and fallopian tube.   Hx of POTs, lap appy and lap chole. FHx of ovarian cancer in 22yo cousin.  S/p 2 Days Post-Op Procedure(s): XI ROBOTIC ASSISTED LAPAROSCOPIC OVARIAN CYSTECTOMY, DIAGNOSTIC LAPAROSCOPY XI ROBOTIC ASSISTED SALPINGO OOPHORECTOMY EVACUATION OF HEMOPERITONEUM  Postop course has been slow and did not relieve her pain. No acute peritoneal sx.  Today she is noting bilateral lower back pain at the muscles lateral to spine, and new onset mid-epigastric pain. She endorses nausea with every meal controlled with zofran. She describes 20-30 lb weight loss in the last 6-8 weeks with similar nausea and some vomiting. Endorses mild dysuria.  She is tearful from being in pain and missing her child at home.  CT scan yesterday remarkable for redundant bowel but no final read posted yet. No obvious hemoperitoneum, bowel injury, free air or operative injury noted on my read.   Urine output adequate, is tolerating regular diet in small amounts.  Enema yesterday successful, with another more formal bowel movement this morning.  S/p general surgery consult yesterday, concurring with above and encouraging bowel regimen.  Objective   Vitals:   10/31/22 2108 11/01/22 0853  BP: 99/61 (!) 102/54  Pulse: 67 89  Resp: 20 18  Temp: 98 F (36.7 C) 98.2 F (36.8 C)  SpO2: 99% 100%     Intake/Output Summary (Last 24 hours) at 11/01/2022 1610 Last data filed at 11/01/2022 0900 Gross per 24 hour  Intake --  Output 2700 ml  Net -2700 ml    General: NAD Cardiovascular: RRR Pulmonary: CTAB Abdomen: Benign. mildly-tender throughout, +BS, +voluntary guarding. Incisions c/d/I and covered. Extremities: No erythema or cords, no calf tenderness, +warmth with  normal peripheral pulses.  Labs: No results found for this or any previous visit (from the past 24 hour(s)).  Cultures: Results for orders placed or performed during the hospital encounter of 10/23/22  Chlamydia/NGC rt PCR ([facility] only)     Status: None   Collection Time: 10/23/22 11:42 AM   Specimen: Cervical/Vaginal swab  Result Value Ref Range Status   Specimen source GC/Chlam ENDOCERVICAL  Final   Chlamydia Tr NOT DETECTED NOT DETECTED Final   N gonorrhoeae NOT DETECTED NOT DETECTED Final    Comment: (NOTE) This CT/NG assay has not been evaluated in patients with a history of  hysterectomy. Performed at [facility], [address] [place], [place] [address]   Wet prep, genital     Status: Abnormal   Collection Time: 10/23/22 11:42 AM   Specimen: Cervical/Vaginal swab  Result Value Ref Range Status   Yeast Wet Prep HPF POC NONE SEEN NONE SEEN Final   Trich, Wet Prep NONE SEEN NONE SEEN Final   Clue Cells Wet Prep HPF POC PRESENT (A) NONE SEEN Final   WBC, Wet Prep HPF POC <10 <10 Final   Sperm NONE SEEN  Final    Comment: Performed at [facility], [address] [place], [place] [address]    Imaging: US PELVIC COMPLETE W TRANSVAGINAL AND TORSION R/O  Result Date: 10/30/2022 CLINICAL DATA:  Abdominal pain for 2 weeks EXAM: TRANSABDOMINAL AND TRANSVAGINAL ULTRASOUND OF PELVIS DOPPLER ULTRASOUND OF OVARIES TECHNIQUE: Both transabdominal and transvaginal ultrasound examinations of the pelvis were performed. Transabdominal technique was performed for global imaging of the pelvis including  uterus, ovaries, adnexal regions, and pelvic cul-de-sac. It was necessary to proceed with endovaginal exam following the transabdominal exam to visualize the adnexa and endometrium. Color and duplex Doppler ultrasound was utilized to evaluate blood flow to the ovaries. COMPARISON:  Ultrasound 10/25/2022 and older. FINDINGS: Uterus Measurements: 8.8 x 4.2 x 5.3 cm = volume: 102  mL. No fibroids or other mass visualized. Prominent uterine vessels. Endometrium Thickness: 10 mm.  No focal abnormality visualized. Right ovary Measurements: 4.0 x 2.3 x 2.4 cm = volume: 12 mL. Normal appearance/no adnexal mass. Left ovary Measurements: 5.1 x 3.4 x 3.6 cm = volume: 32 mL. There is complex hypoechoic area in the left ovary measuring 2.7 x 2.8 x 2.6 cm today previously similar size focus was seen measuring 2.5 x 2.2 x 2.4 cm. Few more internal echoes in the structure no discrete abnormal blood flow within this structure. Pulsed Doppler evaluation of both ovaries demonstrates normal low-resistance arterial and venous waveforms. Other findings Small amount of free fluid in the pelvis. IMPRESSION: 2.8 cm complex cystic structure in the left ovary. This could be an evolving hemorrhagic or functional cyst based on overall appearance. Of note on older exam from 10/23/2022 left ovarian lesion was measured at 4.3 cm. Preserved blood flow to the left ovary. Trace free fluid in the pelvis. With a persistent pain would recommend follow up imaging in 6-12 weeks to confirm resolution. Electronically Signed   By: Karen Kays M.D.   On: 10/30/2022 13:37   US PELVIC COMPLETE W TRANSVAGINAL AND TORSION R/O  Result Date: 10/25/2022 CLINICAL DATA:  Pelvic pain beginning this morning. EXAM: TRANSABDOMINAL AND TRANSVAGINAL ULTRASOUND OF PELVIS DOPPLER ULTRASOUND OF OVARIES TECHNIQUE: Both transabdominal and transvaginal ultrasound examinations of the pelvis were performed. Transabdominal technique was performed for global imaging of the pelvis including uterus, ovaries, adnexal regions, and pelvic cul-de-sac. It was necessary to proceed with endovaginal exam following the transabdominal exam to visualize the endometrium and adnexa. Color and duplex Doppler ultrasound was utilized to evaluate blood flow to the ovaries. COMPARISON:  Pelvic ultrasound 10/23/2022 FINDINGS: Uterus Measurements: 8.0 x 4.1 x 5.0 cm =  volume: 86.5 mL. No fibroids or other mass visualized. Endometrium Thickness: 7 mm, within normal limits. No focal abnormality visualized. Right ovary Measurements: 2.8 x 2.3 x 2.0 cm = volume: 7.0 mL. Normal appearance/no adnexal mass. Left ovary Measurements: 4.3 x 3.6 x 2.8 cm = volume: 22.4 mL. The previously seen echogenic structure has changed. A hypoechoic lesion is now present, measuring 2.5 x 2.2 x 2.4 cm. This likely represents a broad treated cyst. Pulsed Doppler evaluation of both ovaries demonstrates normal low-resistance arterial and venous waveforms. Other findings Trace free fluid is present. IMPRESSION: 1. The previously seen echogenic structure in the left ovary has changed. A hypoechoic lesion is now present, measuring 2.5 x 2.2 x 2.4 cm. This likely represents a hemorrhagic cyst. 2. Trace free fluid in the pelvis is likely physiologic. 3. Normal appearance of the uterus and right ovary. Electronically Signed   By: Marin Roberts M.D.   On: 10/25/2022 10:47   US PELVIC COMPLETE W TRANSVAGINAL AND TORSION R/O  Result Date: 10/23/2022 CLINICAL DATA:  Sharp left lower quadrant pain EXAM: TRANSABDOMINAL AND TRANSVAGINAL ULTRASOUND OF PELVIS DOPPLER ULTRASOUND OF OVARIES TECHNIQUE: Both transabdominal and transvaginal ultrasound examinations of the pelvis were performed. Transabdominal technique was performed for global imaging of the pelvis including uterus, ovaries, adnexal regions, and pelvic cul-de-sac. It was necessary to proceed with endovaginal exam following  the transabdominal exam to visualize the ovaries. Color and duplex Doppler ultrasound was utilized to evaluate blood flow to the ovaries. COMPARISON:  None Available. FINDINGS: Uterus Measurements: 9.8 cm in sagittal dimension. No fibroids or other mass visualized. Endometrium Thickness: 9.  No focal abnormality visualized. Right ovary Measurements: 3.4 x 2.8 x 1.9 cm = volume: 9.7 mL. Normal appearance. No adnexal mass. Left  ovary Measurements: 5.1 x 4.8 x 3.9 cm = volume: 50.9 mL. Contains a heterogeneously echogenic structure measuring 4.3 x 4.2 x 3.4 cm. No definite internal vascularity. Pulsed Doppler evaluation of both ovaries demonstrates normal low-resistance arterial and venous waveforms in the right ovary and within the peripheral ovarian stroma in the left ovary. Other findings No abnormal free fluid. IMPRESSION: 1. Heterogeneously echogenic structure in the left ovary measuring up to 4.3 cm without appreciable central vascularity, which may represent a hemorrhagic cyst or endometrioma. Consider repeat ultrasound examination in 6 weeks to ensure resolution. 2. No current finding of ovarian torsion. Recommend repeat ultrasound examination with Doppler if there is acute worsening of symptoms as the asymmetrically enlarged size of the left ovary may act as a lead point for torsion. Electronically Signed   By: Agustin Cree M.D.   On: 10/23/2022 10:39     Assessment   32 y.o. G1P1001 Hospital Day: 3   Plan   2 Days Post-Op Procedure(s): XI ROBOTIC ASSISTED LAPAROSCOPIC OVARIAN CYSTECTOMY, DIAGNOSTIC LAPAROSCOPY XI ROBOTIC ASSISTED SALPINGO OOPHORECTOMY EVACUATION OF HEMOPERITONEUM  - Postop: Pain limiting her recovery. She is ambulating slowly. PT eval today for risk reduction and back pain improvement. Otherwise, no signs of surgical complications intra-abdominally. SCDs ordered until fully ambulatory. With a short, minimally invasive surgery without malignancy, will hold starting LMWH at this time. ERAS with rescue iv dilaudid as needed - New onset back and mid-epigastric pain: hospitalist consult today. Pt is concerned that we are missing something more insidious with her pain, weight loss, nausea. I do not think her current sx are gyn or surgical in nature, and will appreciate their perspective. Heating pad in use. - dysuria- UA/Cx ordered - PPx- IS ordered - constipation- could be contributing. Bowel regimen  ordered - mood sx- appropriate to situation, but antidepressant or psych/pastoral care would be reasonable. Going home would help the most. - FHx of ovarian cancer: Plan for BRCA and HNPCC testing as outpatient

## 2022-11-02 DIAGNOSIS — N83209 Unspecified ovarian cyst, unspecified side: Secondary | ICD-10-CM | POA: Diagnosis not present

## 2022-11-02 DIAGNOSIS — K2970 Gastritis, unspecified, without bleeding: Secondary | ICD-10-CM

## 2022-11-02 DIAGNOSIS — D649 Anemia, unspecified: Secondary | ICD-10-CM | POA: Insufficient documentation

## 2022-11-02 DIAGNOSIS — R109 Unspecified abdominal pain: Secondary | ICD-10-CM

## 2022-11-02 DIAGNOSIS — D696 Thrombocytopenia, unspecified: Secondary | ICD-10-CM | POA: Insufficient documentation

## 2022-11-02 LAB — TECHNOLOGIST SMEAR REVIEW: Plt Morphology: NORMAL

## 2022-11-02 LAB — CBC WITH DIFFERENTIAL/PLATELET
Abs Immature Granulocytes: 0.02 10*3/uL (ref 0.00–0.07)
Basophils Absolute: 0 10*3/uL (ref 0.0–0.1)
Basophils Relative: 0 %
Eosinophils Absolute: 0.1 10*3/uL (ref 0.0–0.5)
Eosinophils Relative: 1 %
HCT: 36.6 % (ref 36.0–46.0)
Hemoglobin: 11.6 g/dL — ABNORMAL LOW (ref 12.0–15.0)
Immature Granulocytes: 0 %
Lymphocytes Relative: 25 %
Lymphs Abs: 2.2 10*3/uL (ref 0.7–4.0)
MCH: 29.6 pg (ref 26.0–34.0)
MCHC: 31.7 g/dL (ref 30.0–36.0)
MCV: 93.4 fL (ref 80.0–100.0)
Monocytes Absolute: 0.5 10*3/uL (ref 0.1–1.0)
Monocytes Relative: 5 %
Neutro Abs: 5.8 10*3/uL (ref 1.7–7.7)
Neutrophils Relative %: 69 %
Platelets: 238 10*3/uL (ref 150–400)
RBC: 3.92 MIL/uL (ref 3.87–5.11)
RDW: 14.2 % (ref 11.5–15.5)
WBC: 8.6 10*3/uL (ref 4.0–10.5)
nRBC: 0 % (ref 0.0–0.2)

## 2022-11-02 LAB — COMPREHENSIVE METABOLIC PANEL
ALT: 20 U/L (ref 0–44)
AST: 23 U/L (ref 15–41)
Albumin: 4.2 g/dL (ref 3.5–5.0)
Alkaline Phosphatase: 48 U/L (ref 38–126)
Anion gap: 8 (ref 5–15)
BUN: 9 mg/dL (ref 6–20)
CO2: 30 mmol/L (ref 22–32)
Calcium: 8.9 mg/dL (ref 8.9–10.3)
Chloride: 102 mmol/L (ref 98–111)
Creatinine, Ser: 0.84 mg/dL (ref 0.44–1.00)
GFR, Estimated: >60 mL/min (ref >60)
Glucose, Bld: 97 mg/dL (ref 70–99)
Potassium: 4 mmol/L (ref 3.5–5.1)
Sodium: 140 mmol/L (ref 135–145)
Total Bilirubin: 0.6 mg/dL (ref 0.3–1.2)
Total Protein: 7 g/dL (ref 6.5–8.1)

## 2022-11-02 LAB — TYPE AND SCREEN
ABO/RH(D): O POS
Antibody Screen: NEGATIVE

## 2022-11-02 MED ORDER — SODIUM CHLORIDE 0.9 % IV BOLUS
1000.0000 mL | Freq: Once | INTRAVENOUS | Status: AC
  Administered 2022-11-02: 1000 mL via INTRAVENOUS

## 2022-11-02 NOTE — Assessment & Plan Note (Signed)
Hemoglobin 11.5-->9.3 on 8/30 postoperatively Stat hemoglobin pending in the setting of heavy menstrual bleeding Management per primary team Transfuse for hemoglobin less than 7 or severely symptomatic

## 2022-11-02 NOTE — Consult Note (Addendum)
Initial Consultation Note   Patient: Amy Salas:[id number] DOB: [birth year] PCP: Pcp, No DOA: 10/30/2022 DOS: the patient was seen and examined on 11/02/2022 Primary service: Christeen Douglas, MD  Referring physician: Christeen Douglas, MD Reason for consult: recurrent abd pain   Assessment/Plan: Assessment and Plan: Thrombocytopenia (HCC) Plt 146 on 8/30 CBC  Getting repeat CBC to correlate   Anemia Hemoglobin 11.5-->9.3 on 8/30 postoperatively Stat hemoglobin pending in the setting of heavy menstrual bleeding Management per primary team Transfuse for hemoglobin less than 7 or severely symptomatic   Abdominal pain Was reconsulted in the setting of epigastric pain as well as lower abdominal pain with new onset pelvic cramping and heavy menstrual bleeding postoperatively for hemorrhagic ovarian cyst Case discussed at length with Dr. Timothy Lasso Will continue IV PPI treatment Avoid NSAIDs No overt hematemesis, coffee-ground emesis, black or bloody stools Dr. Timothy Lasso to formally consult in the morning Patient does appear to have a significant amount of pelvic pain with heavy menstrual bleeding Will need to be evaluated from a GYN standpoint especially given recent oophorectomy for hemorrhagic ovarian cyst. There also appears to be an element of possible functional abdominal pain which may be confounding issue Avoidance of NSAIDs in the setting of gastritis and active menstrual pain may also exacerbate pain profile Plan of care discussed at length with Dr. Dalbert Garnet       Greater than 50% was spent in counseling and coordination of care with patient Total encounter time 65 minutes or more  TRH will continue to follow the patient.  HPI: Amy Salas is a 32 y.o. female with past medical history of ovarian cyst, POTS syndrome, PTSD seizure disorder presenting to the hospital with recurrent lower abdominal pain with noted hemorrhagic ovarian cyst.  Was  initially consulted yesterday with evaluation concerning for gastritis.  Patient started on IV PPI, trial of GI cocktail as well as bowel regimen per general surgery recommendations.  Per Dr. Dalbert Garnet, plan was to discharge patient in the morning with trial of PPI treatment as well as bowel regimen.  Received report about patient having severe lower abdominal pain with severe cramping and menstrual bleeding.  On evaluation, patient states that IV PPI gave some transient improvement though with some still present epigastric pain.  Positive nausea and vomiting.?  Brown versus black emesis per patient.  No black or bloody stools.  No chest pain or shortness of breath. Currently afebrile systolic pressures in the 90s to 100s.  Satting well on room air.  CBC and CMP are pending.  Review of Systems: As mentioned in the history of present illness. All other systems reviewed and are negative. Past Medical History:  Diagnosis Date   Ovarian cyst    POTS (postural orthostatic tachycardia syndrome)    PTSD (post-traumatic stress disorder)    Seizure (HCC)    Past Surgical History:  Procedure Laterality Date   CHOLECYSTECTOMY     INGUINAL HERNIA REPAIR Bilateral    LAPAROSCOPIC APPENDECTOMY N/A 10/23/2020   Procedure: APPENDECTOMY LAPAROSCOPIC;  Surgeon: Leafy Ro, MD;  Location: [facility];  Service: General;  Laterality: N/A;   ROBOTIC ASSISTED LAPAROSCOPIC OVARIAN CYSTECTOMY Left 10/30/2022   Procedure: XI ROBOTIC ASSISTED LAPAROSCOPIC OVARIAN CYSTECTOMY, DIAGNOSTIC LAPAROSCOPY;  Surgeon: Christeen Douglas, MD;  Location: [facility];  Service: Gynecology;  Laterality: Left;   ROBOTIC ASSISTED SALPINGO OOPHERECTOMY Left 10/30/2022   Procedure: XI ROBOTIC ASSISTED SALPINGO OOPHORECTOMY;  Surgeon: Christeen Douglas, MD;  Location: [facility];  Service: Gynecology;  Laterality: Left;  Social History:  reports that she has never smoked. She has never used smokeless tobacco. No history on file for alcohol use and  drug use.  Allergies  Allergen Reactions   Bactrim [Sulfamethoxazole-Trimethoprim] Other (See Comments)    seizure   Sulfa Antibiotics     seizures    History reviewed. No pertinent family history.  Prior to Admission medications   Medication Sig Start Date End Date Taking? Authorizing Provider  cephALEXin (KEFLEX) 500 MG capsule Take 1 capsule (500 mg total) by mouth 3 (three) times daily. 12/27/20   Minna Antis, MD  ibuprofen (ADVIL) 600 MG tablet Take 1 tablet (600 mg total) by mouth every 6 (six) hours as needed. 10/24/20   Donovan Kail, PA-C  ondansetron (ZOFRAN-ODT) 8 MG disintegrating tablet Take 1 tablet (8 mg total) by mouth every 8 (eight) hours as needed for nausea or vomiting. 10/24/20   Donovan Kail, PA-C  oxyCODONE (OXY IR/ROXICODONE) 5 MG immediate release tablet Take 1 tablet (5 mg total) by mouth every 4 (four) hours as needed for severe pain or breakthrough pain. 10/24/20   Donovan Kail, PA-C  oxyCODONE-acetaminophen (PERCOCET) 5-325 MG tablet Take 1 tablet by mouth every 8 (eight) hours as needed for severe pain. 10/25/22 10/25/23  Jene Every, MD  valACYclovir (VALTREX) 500 MG tablet Take 500 mg by mouth 2 (two) times daily as needed.    [provider]    Physical Exam: Vitals:   11/01/22 2349 11/02/22 0831 11/02/22 0903 11/02/22 1619  BP: 92/63 101/65 100/63 94/64  Pulse: (!) 54 75 (!) 57 70  Resp: 18 16 18 16   Temp: 98.5 F (36.9 C) 98.3 F (36.8 C) 98.1 F (36.7 C) 98.4 F (36.9 C)  TempSrc:  Oral  Oral  SpO2: 100% 99% 99% 98%  Weight:      Height:       Physical Exam Constitutional:      Appearance: She is normal weight.  HENT:     Head: Normocephalic.     Nose: Nose normal.  Cardiovascular:     Rate and Rhythm: Normal rate and regular rhythm.     Pulses: Normal pulses.  Pulmonary:     Effort: Pulmonary effort is normal.  Abdominal:     General: Abdomen is flat. Bowel sounds are normal.     Comments: + epigastric  TTP    Musculoskeletal:        General: Normal range of motion.  Skin:    General: Skin is warm.  Neurological:     General: No focal deficit present.  Psychiatric:        Mood and Affect: Mood normal.  Data Reviewed:   There are no new results to review at this time.    Family Communication: Plan of care discussed w/ the patient  Primary team communication: Plan of care discussed at length with Dr. Dalbert Garnet  Thank you very much for involving [place] in the care of your patient.  Author: Floydene Flock, MD 11/02/2022 5:01 PM  For on call review [email or web address].

## 2022-11-02 NOTE — Assessment & Plan Note (Signed)
Plt 146 on 8/30 CBC  Getting repeat CBC to correlate

## 2022-11-02 NOTE — Plan of Care (Signed)

## 2022-11-02 NOTE — Progress Notes (Signed)
Obstetric and Gynecology  Subjective  Amy Salas is a 32 y.o. female G1P1001 who presented on 10/30/2022 for abdominal/pelvic pain localized to the left lower pelvis, found to have a ruptured hemorrhagic ovarian cyst and fallopian tube.   Hx of POTs, lap appy and lap chole. FHx of ovarian cancer in 22yo cousin.  S/p 3 Days Post-Op Procedure(s): XI ROBOTIC ASSISTED LAPAROSCOPIC OVARIAN CYSTECTOMY, DIAGNOSTIC LAPAROSCOPY XI ROBOTIC ASSISTED SALPINGO OOPHORECTOMY EVACUATION OF HEMOPERITONEUM  Postop course has been slow and did not relieve her pain. No acute peritoneal sx.  Today she is noting bilateral lower back pain at the muscles lateral to spine, and new onset mid-epigastric pain. She endorses nausea with every meal controlled with zofran. She describes 20-30 lb weight loss in the last 6-8 weeks with similar nausea and some vomiting. Endorses mild dysuria.  She is tearful from being in pain and missing her child at home.  CT scan yesterday remarkable for redundant bowel but no final read posted yet. No obvious hemoperitoneum, bowel injury, free air or operative injury noted on my read.   Urine output adequate, is tolerating regular diet in small amounts.  Enema yesterday successful, with another more formal bowel movement this morning.  S/p general surgery consult yesterday, concurring with above and encouraging bowel regimen.  Objective   Vitals:   11/02/22 0903 11/02/22 1619  BP: 100/63 94/64  Pulse: (!) 57 70  Resp: 18 16  Temp: 98.1 F (36.7 C) 98.4 F (36.9 C)  SpO2: 99% 98%     Intake/Output Summary (Last 24 hours) at 11/02/2022 1649 Last data filed at 11/02/2022 1357 Gross per 24 hour  Intake 860 ml  Output 1200 ml  Net -340 ml    General: NAD Cardiovascular: RRR Pulmonary: CTAB Abdomen: Benign. mildly-tender throughout, +BS, +voluntary guarding. Incisions c/d/I and covered. Extremities: No erythema or cords, no calf tenderness, +warmth with  normal peripheral pulses.  Labs: No results found for this or any previous visit (from the past 24 hour(s)).  Cultures: Results for orders placed or performed during the hospital encounter of 10/30/22  Urine Culture     Status: Abnormal (Preliminary result)   Collection Time: 11/01/22 10:16 AM   Specimen: Urine, Clean Catch  Result Value Ref Range Status   Specimen Description   Final    URINE, CLEAN CATCH Performed at [facility], [address], [place], [place] [address]    Special Requests   Final    NONE Performed at [facility], [address], [place], [place] [address]    Culture (A)  Final    >=100,000 COLONIES/mL ESCHERICHIA COLI SUSCEPTIBILITIES TO FOLLOW Performed at [facility], [address], [place], [place] [address]    Report Status PENDING  Incomplete    Imaging: CT ABDOMEN PELVIS W CONTRAST  Result Date: 11/01/2022 CLINICAL DATA:  One day postop. Status post robotic assisted laparoscopic salpingo-oophorectomy. Patient complains of abdominal pain. EXAM: CT ABDOMEN AND PELVIS WITH CONTRAST TECHNIQUE: Multidetector CT imaging of the abdomen and pelvis was performed using the standard protocol following bolus administration of intravenous contrast. RADIATION DOSE REDUCTION: This exam was performed according to the departmental dose-optimization program which includes automated exposure control, adjustment of the mA and/or kV according to patient size and/or use of iterative reconstruction technique. CONTRAST:  75mL OMNIPAQUE IOHEXOL 300 MG/ML  SOLN COMPARISON:  12/27/2020. FINDINGS: Lower chest: Mild platelike atelectasis noted within the lung bases. No pleural fluid or consolidative change. Hepatobiliary: No suspicious liver  abnormality. The gallbladder is either decompressed or surgically absent. No bile duct dilatation. Pancreas: Unremarkable. No pancreatic ductal dilatation or surrounding inflammatory changes. Spleen: Normal in size  without focal abnormality. Adrenals/Urinary Tract: Normal adrenal glands. No nephrolithiasis or signs of obstructive uropathy. Too small to characterize right kidney cysts measure up to 6 mm compatible with Bosniak class 2 lesions. No follow-up imaging recommended. No suspicious bladder abnormality. Small amount of gas noted within the non dependent bladder lumen which likely reflects recent instrumentation. Stomach/Bowel: Moderate distension the gastric lumen with enteric contrast material. The appendix is not visualized. Postsurgical change at the cecal base is favored to represent prior appendectomy. No bowel wall thickening, inflammation or distension. Vascular/Lymphatic: No significant vascular findings are present. No enlarged abdominal or pelvic lymph nodes. Reproductive: The uterus appears normal. Postoperative changes within the pelvis compatible with salpingo-oophorectomy. Other: There is a small volume of free fluid noted within the dependent portion of the pelvis, image 61/2. No focal fluid collections identified to suggest abscess. New scratch there is pneumoperitoneum identified. This is within normal limits for postop day 1 status post laparoscopic surgery. The visualized osseous structures appear normal. Musculoskeletal: No acute or significant osseous findings. IMPRESSION: 1. Postoperative changes compatible with salpingo-oophorectomy from the previous day. 2. Small volume of free fluid noted within the dependent portion of the pelvis. Compatible with recent postoperative change. 3. No signs of abscess or bowel obstruction. Electronically Signed   By: Signa Kell M.D.   On: 11/01/2022 10:06   US PELVIC COMPLETE W TRANSVAGINAL AND TORSION R/O  Result Date: 10/30/2022 CLINICAL DATA:  Abdominal pain for 2 weeks EXAM: TRANSABDOMINAL AND TRANSVAGINAL ULTRASOUND OF PELVIS DOPPLER ULTRASOUND OF OVARIES TECHNIQUE: Both transabdominal and transvaginal ultrasound examinations of the pelvis were  performed. Transabdominal technique was performed for global imaging of the pelvis including uterus, ovaries, adnexal regions, and pelvic cul-de-sac. It was necessary to proceed with endovaginal exam following the transabdominal exam to visualize the adnexa and endometrium. Color and duplex Doppler ultrasound was utilized to evaluate blood flow to the ovaries. COMPARISON:  Ultrasound 10/25/2022 and older. FINDINGS: Uterus Measurements: 8.8 x 4.2 x 5.3 cm = volume: 102 mL. No fibroids or other mass visualized. Prominent uterine vessels. Endometrium Thickness: 10 mm.  No focal abnormality visualized. Right ovary Measurements: 4.0 x 2.3 x 2.4 cm = volume: 12 mL. Normal appearance/no adnexal mass. Left ovary Measurements: 5.1 x 3.4 x 3.6 cm = volume: 32 mL. There is complex hypoechoic area in the left ovary measuring 2.7 x 2.8 x 2.6 cm today previously similar size focus was seen measuring 2.5 x 2.2 x 2.4 cm. Few more internal echoes in the structure no discrete abnormal blood flow within this structure. Pulsed Doppler evaluation of both ovaries demonstrates normal low-resistance arterial and venous waveforms. Other findings Small amount of free fluid in the pelvis. IMPRESSION: 2.8 cm complex cystic structure in the left ovary. This could be an evolving hemorrhagic or functional cyst based on overall appearance. Of note on older exam from 10/23/2022 left ovarian lesion was measured at 4.3 cm. Preserved blood flow to the left ovary. Trace free fluid in the pelvis. With a persistent pain would recommend follow up imaging in 6-12 weeks to confirm resolution. Electronically Signed   By: Karen Kays M.D.   On: 10/30/2022 13:37   US PELVIC COMPLETE W TRANSVAGINAL AND TORSION R/O  Result Date: 10/25/2022 CLINICAL DATA:  Pelvic pain beginning this morning. EXAM: TRANSABDOMINAL AND TRANSVAGINAL ULTRASOUND OF PELVIS DOPPLER ULTRASOUND OF  OVARIES TECHNIQUE: Both transabdominal and transvaginal ultrasound examinations of the  pelvis were performed. Transabdominal technique was performed for global imaging of the pelvis including uterus, ovaries, adnexal regions, and pelvic cul-de-sac. It was necessary to proceed with endovaginal exam following the transabdominal exam to visualize the endometrium and adnexa. Color and duplex Doppler ultrasound was utilized to evaluate blood flow to the ovaries. COMPARISON:  Pelvic ultrasound 10/23/2022 FINDINGS: Uterus Measurements: 8.0 x 4.1 x 5.0 cm = volume: 86.5 mL. No fibroids or other mass visualized. Endometrium Thickness: 7 mm, within normal limits. No focal abnormality visualized. Right ovary Measurements: 2.8 x 2.3 x 2.0 cm = volume: 7.0 mL. Normal appearance/no adnexal mass. Left ovary Measurements: 4.3 x 3.6 x 2.8 cm = volume: 22.4 mL. The previously seen echogenic structure has changed. A hypoechoic lesion is now present, measuring 2.5 x 2.2 x 2.4 cm. This likely represents a broad treated cyst. Pulsed Doppler evaluation of both ovaries demonstrates normal low-resistance arterial and venous waveforms. Other findings Trace free fluid is present. IMPRESSION: 1. The previously seen echogenic structure in the left ovary has changed. A hypoechoic lesion is now present, measuring 2.5 x 2.2 x 2.4 cm. This likely represents a hemorrhagic cyst. 2. Trace free fluid in the pelvis is likely physiologic. 3. Normal appearance of the uterus and right ovary. Electronically Signed   By: Marin Roberts M.D.   On: 10/25/2022 10:47   US PELVIC COMPLETE W TRANSVAGINAL AND TORSION R/O  Result Date: 10/23/2022 CLINICAL DATA:  Sharp left lower quadrant pain EXAM: TRANSABDOMINAL AND TRANSVAGINAL ULTRASOUND OF PELVIS DOPPLER ULTRASOUND OF OVARIES TECHNIQUE: Both transabdominal and transvaginal ultrasound examinations of the pelvis were performed. Transabdominal technique was performed for global imaging of the pelvis including uterus, ovaries, adnexal regions, and pelvic cul-de-sac. It was necessary to  proceed with endovaginal exam following the transabdominal exam to visualize the ovaries. Color and duplex Doppler ultrasound was utilized to evaluate blood flow to the ovaries. COMPARISON:  None Available. FINDINGS: Uterus Measurements: 9.8 cm in sagittal dimension. No fibroids or other mass visualized. Endometrium Thickness: 9.  No focal abnormality visualized. Right ovary Measurements: 3.4 x 2.8 x 1.9 cm = volume: 9.7 mL. Normal appearance. No adnexal mass. Left ovary Measurements: 5.1 x 4.8 x 3.9 cm = volume: 50.9 mL. Contains a heterogeneously echogenic structure measuring 4.3 x 4.2 x 3.4 cm. No definite internal vascularity. Pulsed Doppler evaluation of both ovaries demonstrates normal low-resistance arterial and venous waveforms in the right ovary and within the peripheral ovarian stroma in the left ovary. Other findings No abnormal free fluid. IMPRESSION: 1. Heterogeneously echogenic structure in the left ovary measuring up to 4.3 cm without appreciable central vascularity, which may represent a hemorrhagic cyst or endometrioma. Consider repeat ultrasound examination in 6 weeks to ensure resolution. 2. No current finding of ovarian torsion. Recommend repeat ultrasound examination with Doppler if there is acute worsening of symptoms as the asymmetrically enlarged size of the left ovary may act as a lead point for torsion. Electronically Signed   By: Agustin Cree M.D.   On: 10/23/2022 10:39     Assessment   32 y.o. G1P1001 Hospital Day: 4   Plan   2 Days Post-Op Procedure(s): XI ROBOTIC ASSISTED LAPAROSCOPIC OVARIAN CYSTECTOMY, DIAGNOSTIC LAPAROSCOPY XI ROBOTIC ASSISTED SALPINGO OOPHORECTOMY EVACUATION OF HEMOPERITONEUM  - New onset vaginal bleeding: Expected withdrawal bleeding as I removed her luteal cyst in surgery. It sounds heavier and more painful than a normal period, but on my exam this is an  expected hormonally trigger withdrawal bleed - Anemia: Repeat CBC pending now. Consider transfusion  or iv iron PRN. Given hx of POTS, her BP is at baseline, and she is not tachycardic. Most likely is intra-op hemoperitoneum with dilutional anemia. Peripheral smear pending for platelets trending down   - Postop:    - NSAIDs held, tylenol and dilaudid PRN. Will tansistion to po meds in anticipation of home management     - s/p daily PT to assist with ambulation. SCDs ordered until fully ambulatory. With a short, minimally invasive surgery without malignancy, will hold starting LMWH at this time.   - Back and mid-epigastric pain: hospitalist input appreciated. Stopped NSAIDs and started PPI/bowel regimen.  Heating pad in use. As her sx are not improved, appreciate GI recommendations.  - Dysuria- UA negative, UCx pending - PPx- IS ordered - constipation- improving. No black or green stools. BM yesterday and today - FHx of ovarian cancer: Plan for BRCA and HNPCC testing as outpatient. Will keep already scheduled appointment in our office.

## 2022-11-02 NOTE — Plan of Care (Signed)
  Problem: Skin Integrity: Goal: Demonstration of wound healing without infection will improve Outcome: Progressing   Problem: Clinical Measurements: Goal: Ability to maintain clinical measurements within normal limits will improve Outcome: Progressing Goal: Will remain free from infection Outcome: Progressing

## 2022-11-03 DIAGNOSIS — N83209 Unspecified ovarian cyst, unspecified side: Secondary | ICD-10-CM

## 2022-11-03 LAB — PHOSPHORUS: Phosphorus: 4.1 mg/dL (ref 2.5–4.6)

## 2022-11-03 LAB — IRON AND TIBC
Iron: 26 ug/dL — ABNORMAL LOW (ref 28–170)
Saturation Ratios: 12 % (ref 10.4–31.8)
TIBC: 210 ug/dL — ABNORMAL LOW (ref 250–450)
UIBC: 184 ug/dL

## 2022-11-03 LAB — CBC
HCT: 29.6 % — ABNORMAL LOW (ref 36.0–46.0)
Hemoglobin: 9.8 g/dL — ABNORMAL LOW (ref 12.0–15.0)
MCH: 30.3 pg (ref 26.0–34.0)
MCHC: 33.1 g/dL (ref 30.0–36.0)
MCV: 91.6 fL (ref 80.0–100.0)
Platelets: 184 10*3/uL (ref 150–400)
RBC: 3.23 MIL/uL — ABNORMAL LOW (ref 3.87–5.11)
RDW: 14.1 % (ref 11.5–15.5)
WBC: 4 10*3/uL (ref 4.0–10.5)
nRBC: 0 % (ref 0.0–0.2)

## 2022-11-03 LAB — VITAMIN B12: Vitamin B-12: 284 pg/mL (ref 180–914)

## 2022-11-03 LAB — URINE CULTURE: Culture: >=100000 — AB

## 2022-11-03 LAB — VITAMIN D 25 HYDROXY (VIT D DEFICIENCY, FRACTURES): Vit D, 25-Hydroxy: 55.27 ng/mL (ref 30–100)

## 2022-11-03 LAB — FOLATE: Folate: 5.1 ng/mL — ABNORMAL LOW (ref >5.9)

## 2022-11-03 LAB — HEMOGLOBIN AND HEMATOCRIT, BLOOD
HCT: 32.9 % — ABNORMAL LOW (ref 36.0–46.0)
Hemoglobin: 10.6 g/dL — ABNORMAL LOW (ref 12.0–15.0)

## 2022-11-03 LAB — MAGNESIUM: Magnesium: 2.1 mg/dL (ref 1.7–2.4)

## 2022-11-03 MED ORDER — VITAMIN B-12 1000 MCG PO TABS: 1000.0000 ug | ORAL_TABLET | Freq: Every day | ORAL | Status: DC

## 2022-11-03 MED ORDER — FOLIC ACID 1 MG PO TABS
1.0000 mg | ORAL_TABLET | Freq: Every day | ORAL | Status: DC
  Administered 2022-11-03 – 2022-11-05 (×3): 1 mg via ORAL
  Filled 2022-11-03 (×3): qty 1

## 2022-11-03 MED ORDER — BISACODYL 5 MG PO TBEC: 10.0000 mg | DELAYED_RELEASE_TABLET | Freq: Every day | ORAL | Status: DC | PRN

## 2022-11-03 MED ORDER — VITAMIN C 500 MG PO TABS
500.0000 mg | ORAL_TABLET | Freq: Every day | ORAL | Status: DC
  Administered 2022-11-03 – 2022-11-05 (×3): 500 mg via ORAL
  Filled 2022-11-03 (×3): qty 1

## 2022-11-03 MED ORDER — ACETAMINOPHEN 325 MG PO TABS
650.0000 mg | ORAL_TABLET | Freq: Four times a day (QID) | ORAL | Status: DC | PRN
  Administered 2022-11-03 – 2022-11-05 (×3): 650 mg via ORAL
  Filled 2022-11-03 (×3): qty 2

## 2022-11-03 MED ORDER — CYANOCOBALAMIN 1000 MCG/ML IJ SOLN
1000.0000 ug | Freq: Every day | INTRAMUSCULAR | Status: AC
  Administered 2022-11-03 – 2022-11-05 (×3): 1000 ug via INTRAMUSCULAR
  Filled 2022-11-03 (×3): qty 1

## 2022-11-03 MED ORDER — MEDROXYPROGESTERONE ACETATE 10 MG PO TABS
10.0000 mg | ORAL_TABLET | Freq: Two times a day (BID) | ORAL | Status: DC
  Administered 2022-11-03 – 2022-11-05 (×5): 10 mg via ORAL
  Filled 2022-11-03 (×6): qty 1

## 2022-11-03 MED ORDER — CEPHALEXIN 500 MG PO CAPS
500.0000 mg | ORAL_CAPSULE | Freq: Three times a day (TID) | ORAL | Status: DC
  Administered 2022-11-03 – 2022-11-05 (×5): 500 mg via ORAL
  Filled 2022-11-03 (×6): qty 1

## 2022-11-03 MED ORDER — POLYSACCHARIDE IRON COMPLEX 150 MG PO CAPS
150.0000 mg | ORAL_CAPSULE | Freq: Every day | ORAL | Status: DC
  Administered 2022-11-03 – 2022-11-05 (×3): 150 mg via ORAL
  Filled 2022-11-03 (×3): qty 1

## 2022-11-03 MED ORDER — BISACODYL 10 MG RE SUPP: 10.0000 mg | Freq: Every day | RECTAL | Status: DC | PRN

## 2022-11-03 NOTE — Plan of Care (Signed)
  Problem: Education: Goal: Knowledge of General Education information will improve Description: Including pain rating scale, medication(s)/side effects and non-pharmacologic comfort measures Outcome: Progressing   Problem: Activity: Goal: Risk for activity intolerance will decrease Outcome: Progressing   Problem: Nutrition: Goal: Adequate nutrition will be maintained Outcome: Progressing   Problem: Elimination: Goal: Will not experience complications related to urinary retention Outcome: Progressing   Problem: Safety: Goal: Ability to remain free from injury will improve Outcome: Progressing

## 2022-11-03 NOTE — Consult Note (Signed)
Inpatient Consultation   Patient ID: Amy Salas is a 32 y.o. female.  Requesting Provider: Doree Albee, MD  Date of Admission: 10/30/2022  Date of Consult: 11/03/22   Reason for Consultation: abdominal pain   Patient's Chief Complaint:   Chief Complaint  Patient presents with   Abdominal Pain    32 year old Caucasian female with POTS, PTSD, hemorrhagic ovarian cyst status post recent laparoscopic salpingo-oophorectomy and cystectomy who presented to the hospital with worsening abdominal pain and increased vaginal bleeding postintervention  Prior to her procedure the patient was taking 800 mg of ibuprofen twice a day for her discomfort.  She has previously had gastritis noted on EGD in 2022 in [place].  She had not been taking her PPI as of late.  She has similar episode after her cholecystectomy a few years ago as well.  The patient notes that she has lost approximately 20 pounds in 8 weeks relating to the abdominal discomfort and food aversion secondary to this.  She has occasional difficulty swallowing solids like meats, but no issues with pills or liquids.  No odynophagia.  She does not have any current signs of GI bleeding denying hematochezia melena hematemesis and coffee-ground emesis.  CT was performed with findings as below.  CMP within normal limits.  Status postcholecystectomy and appendectomy Denies Anti-plt agents, and anticoagulants Brother with history of colon cancer around age 26 Denies other family history of gastrointestinal disease and malignancy Previous Endoscopies: 11/2020 EGD performed in [place].  Gastritis was demonstrated.  Unknown biopsy results.  A 2 cm hiatal hernia was noted and normal duodenum    Past Medical History:  Diagnosis Date   Ovarian cyst    POTS (postural orthostatic tachycardia syndrome)    PTSD (post-traumatic stress disorder)    Seizure (HCC)     Past Surgical History:  Procedure Laterality Date    CHOLECYSTECTOMY     INGUINAL HERNIA REPAIR Bilateral    LAPAROSCOPIC APPENDECTOMY N/A 10/23/2020   Procedure: APPENDECTOMY LAPAROSCOPIC;  Surgeon: Leafy Ro, MD;  Location: [facility] ORS;  Service: General;  Laterality: N/A;   ROBOTIC ASSISTED LAPAROSCOPIC OVARIAN CYSTECTOMY Left 10/30/2022   Procedure: XI ROBOTIC ASSISTED LAPAROSCOPIC OVARIAN CYSTECTOMY, DIAGNOSTIC LAPAROSCOPY;  Surgeon: Christeen Douglas, MD;  Location: [facility] OR[facility];  Service: Gynecology;  Laterality: Left;   ROBOTIC ASSISTED SALPINGO OOPHERECTOMY Left 10/30/2022   Procedure: XI ROBOTIC ASSISTED SALPINGO OOPHORECTOMY;  Surgeon: Christeen Douglas, MD;  Location: [facility] OR[facility];  Service: Gynecology;  Laterality: Left;    Allergies  Allergen Reactions   Bactrim [Sulfamethoxazole-Trimethoprim] Other (See Comments)    seizure   Sulfa Antibiotics     seizures    History reviewed. No pertinent family history.  Social History   Tobacco Use   Smoking status: Never   Smokeless tobacco: Never     Pertinent GI related history and allergies were reviewed with the patient  Review of Systems  Constitutional:  Positive for appetite change and unexpected weight change. Negative for activity change, chills, diaphoresis, fatigue and fever.  HENT:  Positive for trouble swallowing. Negative for voice change.   Respiratory:  Negative for shortness of breath and wheezing.   Cardiovascular:  Negative for chest pain, palpitations and leg swelling.  Gastrointestinal:  Positive for abdominal pain, constipation and nausea. Negative for abdominal distention, anal bleeding, blood in stool, diarrhea, rectal pain and vomiting.  Musculoskeletal:  Negative for arthralgias and myalgias.  Skin:  Negative for color change and pallor.  Neurological:  Negative for dizziness,  syncope and weakness.  Psychiatric/Behavioral:  Negative for confusion.   All other systems reviewed and are negative.    Medications Home Medications No current facility-administered  medications on file prior to encounter.   Current Outpatient Medications on File Prior to Encounter  Medication Sig Dispense Refill   cephALEXin (KEFLEX) 500 MG capsule Take 1 capsule (500 mg total) by mouth 3 (three) times daily. 30 capsule 0   ibuprofen (ADVIL) 600 MG tablet Take 1 tablet (600 mg total) by mouth every 6 (six) hours as needed. 30 tablet 0   ondansetron (ZOFRAN-ODT) 8 MG disintegrating tablet Take 1 tablet (8 mg total) by mouth every 8 (eight) hours as needed for nausea or vomiting. 20 tablet 0   oxyCODONE (OXY IR/ROXICODONE) 5 MG immediate release tablet Take 1 tablet (5 mg total) by mouth every 4 (four) hours as needed for severe pain or breakthrough pain. 30 tablet 0   oxyCODONE-acetaminophen (PERCOCET) 5-325 MG tablet Take 1 tablet by mouth every 8 (eight) hours as needed for severe pain. 15 tablet 0   valACYclovir (VALTREX) 500 MG tablet Take 500 mg by mouth 2 (two) times daily as needed.     Pertinent GI related medications were reviewed with the patient  Inpatient Medications  Current Facility-Administered Medications:    acetaminophen (TYLENOL) tablet 650 mg, 650 mg, Oral, Q6H PRN, Christeen Douglas, MD, 650 mg at 11/03/22 1155   alum & mag hydroxide-simeth (MAALOX/MYLANTA) 200-200-20 MG/5ML suspension 30 mL, 30 mL, Oral, Q4H PRN, Christeen Douglas, MD   ascorbic acid (VITAMIN C) tablet 500 mg, 500 mg, Oral, Daily, Gillis Santa, MD   bisacodyl (DULCOLAX) EC tablet 10 mg, 10 mg, Oral, Daily PRN, Gillis Santa, MD   bisacodyl (DULCOLAX) suppository 10 mg, 10 mg, Rectal, Daily PRN, Gillis Santa, MD   cephALEXin (KEFLEX) capsule 500 mg, 500 mg, Oral, Q8H, Gillis Santa, MD, 500 mg at 11/03/22 1009   clonazePAM (KLONOPIN) tablet 1 mg, 1 mg, Oral, BID PRN, 1 mg at 11/03/22 0647 **OR** clonazePAM (KLONOPIN) disintegrating tablet 1 mg, 1 mg, Oral, BID PRN, Coulter, Carolyn, RPH   cyanocobalamin (VITAMIN B12) injection 1,000 mcg, 1,000 mcg, Intramuscular, Q1200 **FOLLOWED BY**  [START ON 11/06/2022] cyanocobalamin (VITAMIN B12) tablet 1,000 mcg, 1,000 mcg, Oral, Daily, Lucianne Muss, Dileep, MD   docusate sodium (COLACE) capsule 100 mg, 100 mg, Oral, BID, Christeen Douglas, MD, 100 mg at 11/03/22 1009   folic acid (FOLVITE) tablet 1 mg, 1 mg, Oral, Daily, Gillis Santa, MD   HYDROmorphone (DILAUDID) injection 1-2 mg, 1-2 mg, Intravenous, Q3H PRN, Christeen Douglas, MD, 2 mg at 11/03/22 4034   iron polysaccharides (NIFEREX) capsule 150 mg, 150 mg, Oral, Daily, Gillis Santa, MD   medroxyPROGESTERone (PROVERA) tablet 10 mg, 10 mg, Oral, BID, Christeen Douglas, MD, 10 mg at 11/03/22 1009   menthol-cetylpyridinium (CEPACOL) lozenge 3 mg, 1 lozenge, Oral, Q2H PRN, Christeen Douglas, MD   ondansetron [facility]) tablet 4 mg, 4 mg, Oral, Q6H PRN, 4 mg at 10/31/22 1259 **OR** ondansetron (ZOFRAN) injection 4 mg, 4 mg, Intravenous, Q6H PRN, Christeen Douglas, MD, 4 mg at 11/03/22 7425   oxyCODONE (Oxy IR/ROXICODONE) immediate release tablet 5-10 mg, 5-10 mg, Oral, Q4H PRN, Christeen Douglas, MD, 10 mg at 11/03/22 1154   pantoprazole (PROTONIX) injection 40 mg, 40 mg, Intravenous, Q12H, Floydene Flock, MD, 40 mg at 11/03/22 1009   polyethylene glycol (MIRALAX / GLYCOLAX) packet 17 g, 17 g, Oral, BID, Lynden Oxford R, PA-C   simethicone (MYLICON) chewable tablet 80 mg, 80 mg,  Oral, QID PRN, Christeen Douglas, MD, 80 mg at 10/31/22 2035   sodium phosphate (FLEET) enema 1 enema, 1 enema, Rectal, Daily PRN, Christeen Douglas, MD, 1 enema at 10/31/22 2212   acetaminophen, alum & mag hydroxide-simeth, bisacodyl, bisacodyl, clonazePAM **OR** clonazepam, HYDROmorphone (DILAUDID) injection, menthol-cetylpyridinium, ondansetron **OR** ondansetron (ZOFRAN) IV, oxyCODONE, simethicone, sodium phosphate   Objective   Vitals:   11/02/22 1821 11/02/22 1826 11/02/22 2307 11/03/22 0824  BP: (!) 95/59 104/62 108/66 102/65  Pulse:   (!) 59 61  Resp:   16 18  Temp:   98 F (36.7 C) 98.1 F (36.7 C)  TempSrc:       SpO2:   100% 98%  Weight:      Height:         Physical Exam Vitals and nursing note reviewed.  Constitutional:      General: She is not in acute distress.    Appearance: She is not ill-appearing, toxic-appearing or diaphoretic.  HENT:     Head: Normocephalic and atraumatic.     Nose: Nose normal.     Mouth/Throat:     Mouth: Mucous membranes are moist.     Pharynx: Oropharynx is clear.  Eyes:     General: No scleral icterus.    Extraocular Movements: Extraocular movements intact.  Cardiovascular:     Rate and Rhythm: Normal rate and regular rhythm.     Heart sounds: Normal heart sounds. No murmur heard.    No friction rub. No gallop.  Pulmonary:     Effort: Pulmonary effort is normal. No respiratory distress.     Breath sounds: Normal breath sounds. No wheezing, rhonchi or rales.  Abdominal:     General: Bowel sounds are normal. There is no distension.     Palpations: Abdomen is soft.     Tenderness: There is abdominal tenderness (mild; non peritoneal). There is no guarding or rebound.     Comments: Incision sites tender. CDI.  Musculoskeletal:     Cervical back: Neck supple.     Right lower leg: No edema.     Left lower leg: No edema.  Skin:    General: Skin is warm and dry.     Coloration: Skin is not jaundiced or pale.  Neurological:     General: No focal deficit present.     Mental Status: She is alert and oriented to person, place, and time. Mental status is at baseline.  Psychiatric:        Mood and Affect: Mood normal.        Behavior: Behavior normal.        Thought Content: Thought content normal.        Judgment: Judgment normal.     Laboratory Data Recent Labs  Lab 10/31/22 0053 11/02/22 1642 11/03/22 0836  WBC 7.5 8.6 4.0  HGB 9.3* 11.6* 9.8*  HCT 27.3* 36.6 29.6*  PLT 146* 238 184   Recent Labs  Lab 10/30/22 1055 10/30/22 2200 10/31/22 0053 11/02/22 1642  NA 139 135 136 140  K 3.9 4.0 3.6 4.0  CL 107 105 106 102  CO2 25 19* 22 30   BUN 12 8 8 9   CALCIUM 8.9 8.7* 8.0* 8.9  PROT 6.8  --   --  7.0  BILITOT 0.7  --   --  0.6  ALKPHOS 40  --   --  48  ALT 14  --   --  20  AST 15  --   --  23  GLUCOSE 96 99 136* 97   No results for input(s): "INR" in the last 168 hours.  No results for input(s): "LIPASE" in the last 72 hours.      Imaging Studies: CT abdomen pelvis with contrast 10/31/22 FINDINGS: Lower chest: Mild platelike atelectasis noted within the lung bases. No pleural fluid or consolidative change.   Hepatobiliary: No suspicious liver abnormality. The gallbladder is either decompressed or surgically absent. No bile duct dilatation.   Pancreas: Unremarkable. No pancreatic ductal dilatation or surrounding inflammatory changes.   Spleen: Normal in size without focal abnormality.   Adrenals/Urinary Tract: Normal adrenal glands. No nephrolithiasis or signs of obstructive uropathy. Too small to characterize right kidney cysts measure up to 6 mm compatible with Bosniak class 2 lesions. No follow-up imaging recommended. No suspicious bladder abnormality. Small amount of gas noted within the non dependent bladder lumen which likely reflects recent instrumentation.   Stomach/Bowel: Moderate distension the gastric lumen with enteric contrast material. The appendix is not visualized. Postsurgical change at the cecal base is favored to represent prior appendectomy. No bowel wall thickening, inflammation or distension.   Vascular/Lymphatic: No significant vascular findings are present. No enlarged abdominal or pelvic lymph nodes.   Reproductive: The uterus appears normal. Postoperative changes within the pelvis compatible with salpingo-oophorectomy.   Other: There is a small volume of free fluid noted within the dependent portion of the pelvis, image 61/2. No focal fluid collections identified to suggest abscess. New scratch there is pneumoperitoneum identified. This is within normal limits for  postop day 1 status post laparoscopic surgery.   The visualized osseous structures appear normal.   Musculoskeletal: No acute or significant osseous findings.   IMPRESSION: 1. Postoperative changes compatible with salpingo-oophorectomy from the previous day. 2. Small volume of free fluid noted within the dependent portion of the pelvis. Compatible with recent postoperative change. 3. No signs of abscess or bowel obstruction.  Assessment:   # Epigastric pain and nausea  # occasional dysphagia- solids - hiatal hernia noted in 2022  # hemorrhagic ovarian cyst- s/p intervention - being followed by gynecology   # acute on chronic anemia  # weight loss- 20 lbs in 8 weeks per patient  # heavy nsaid use - 800mg  bid for ~2 weeks pre op  # biliary colic s/p cholecystectomy   # gastritis  # fhx crc- brother ~age 76  Plan:  Esophagogastroduodenoscopy planned for tomorrow pending patient stability and endoscopy suite availability Labs reviewed; ct reviewed; no acute findings on ct NPO at midnight liquids diet now Labs in am- bmp, cbc Protonix 40 mg iv q12 h Hold dvt ppx Monitor H&H.  Transfusion and resuscitation as per primary team; no current signs of gib Avoid frequent lab draws to prevent lab induced anemia Supportive care and antiemetics as per primary team Maintain two sites IV access Avoid nsaids Monitor for GIB.  Needs colonoscopy as an outpatient given her brothers history after she recovers from her recent surgery  Esophagogastroduodenoscopy with possible biopsy, control of bleeding, polypectomy, and interventions as necessary has been discussed with the patient/patient representative. Informed consent was obtained from the patient/patient representative after explaining the indication, nature, and risks of the procedure including but not limited to death, bleeding, perforation, missed neoplasm/lesions, cardiorespiratory compromise, and reaction to medications.  Opportunity for questions was given and appropriate answers were provided. Patient/patient representative has verbalized understanding is amenable to undergoing the procedure.  I personally performed the service.  Management of other medical comorbidities as per primary  team  Thank you for allowing [place] to participate in this patient's care. Please don't hesitate to call if any questions or concerns arise.   [name], DO [facility]  Portions of the record may have been created with voice recognition software. Occasional wrong-word or 'sound-a-like' substitutions may have occurred due to the inherent limitations of voice recognition software.  Read the chart carefully and recognize, using context, where substitutions may have occurred.

## 2022-11-03 NOTE — Progress Notes (Signed)
[facility] Progress Note  Patient: Amy Salas    WGN:[id number]  DOA: 10/30/2022     Date of Service: the patient was seen and examined on 11/03/2022  Chief Complaint  Patient presents with   Abdominal Pain   Brief hospital course: Amy Salas is a 32 y.o. female with past medical history of ovarian cyst, POTS syndrome, PTSD seizure disorder presenting to the hospital with recurrent lower abdominal pain with noted hemorrhagic ovarian cyst.  Was initially consulted yesterday with evaluation concerning for gastritis.  Patient started on IV PPI, trial of GI cocktail as well as bowel regimen per general surgery recommendations.  Per Dr. Dalbert Garnet, plan was to discharge patient in the morning with trial of PPI treatment as well as bowel regimen.  Received report about patient having severe lower abdominal pain with severe cramping and menstrual bleeding.  On evaluation, patient states that IV PPI gave some transient improvement though with some still present epigastric pain.  Positive nausea and vomiting.?  Brown versus black emesis per patient.  No black or bloody stools.  No chest pain or shortness of breath.  [facility] was consulted for abdominal pain.  GI was consulted for GERD possible EGD.  Assessment and Plan:  # Ruptured hemorrhagic ovarian cyst and fallopian tube  S/p laparoscopic ovarian cystectomy, salpingo-oophorectomy and evacuation of hemoperitoneum done by OB/GYN Continue as needed medication for pain control Continue Provera 10 mg p.o. twice daily Further management as per primary team Patient is still having vaginal bleeding  # GERD, continue PPI twice daily Follow GI consult  # UTI, UA positive, urine culture growing E. coli, pansensitive. Started Keflex 40 mg p.o. 3 times daily for 5 days  # Acute blood loss anemia due to vaginal bleeding Monitor H&H and transfuse if hemoglobin less than 7  # Iron deficiency, started oral iron supplement with  vitamin C # Folic acid deficiency, started folic acid oral supplement # Vitamin B12 level 284, goal >400, started vitamin B12 1000 mcg IM injection daily for 3 days followed by oral supplement.   Body mass index is 17.72 kg/m.  Interventions:  Diet: Full liquid diet DVT Prophylaxis: SCD, pharmacological prophylaxis contraindicated due to vaginal bleeding    Advance goals of care discussion: Full code  Family Communication: family was present at bedside, at the time of interview.  The pt provided permission to discuss medical plan with the family. Opportunity was given to ask question and all questions were answered satisfactorily.   Disposition:  Pt is from Home, admitted with ruptured hemorrhagic ovarian cyst, still having significant vaginal bleeding, acute blood loss anemia.  Patient is admitted under OB/GYN [facility] is consulting, we will continue to follow along during her hospital stay.     Subjective: No significant events overnight, patient is still having abdominal pain 8/10 mostly in the lower abdomen and also have some epigastric tenderness.  Patient is still having vaginal bleeding, had several blood clots in the morning.  Denied any chest pain or palpitation, no shortness of breath.  Physical Exam: General: NAD, lying comfortably Appear in no distress, affect appropriate Eyes: PERRLA ENT: Oral Mucosa Clear, moist  Neck: no JVD,  Cardiovascular: S1 and S2 Present, no Murmur,  Respiratory: good respiratory effort, Bilateral Air entry equal and Decreased, no Crackles, no wheezes Abdomen: Bowel Sound present, Soft and generalized tenderness,  Skin: no rashes Extremities: no Pedal edema, no calf tenderness Neurologic: without any new focal findings Gait not checked due to patient safety concerns  Vitals:   11/02/22 1821 11/02/22 1826 11/02/22 2307 11/03/22 0824  BP: (!) 95/59 104/62 108/66 102/65  Pulse:   (!) 59 61  Resp:   16 18  Temp:   98 F (36.7 C) 98.1 F (36.7  C)  TempSrc:      SpO2:   100% 98%  Weight:      Height:        Intake/Output Summary (Last 24 hours) at 11/03/2022 1324 Last data filed at 11/03/2022 0900 Gross per 24 hour  Intake 852.26 ml  Output --  Net 852.26 ml   Filed Weights   10/30/22 1449 10/30/22 1607  Weight: 55.3 kg 54.4 kg    Data Reviewed: I have personally reviewed and interpreted daily labs, tele strips, imagings as discussed above. I reviewed all nursing notes, pharmacy notes, vitals, pertinent old records I have discussed plan of care as described above with RN and patient/family.  CBC: Recent Labs  Lab 10/30/22 1055 10/30/22 2200 10/31/22 0053 11/02/22 1642 11/03/22 0836  WBC 4.2 14.7* 7.5 8.6 4.0  NEUTROABS  --   --   --  5.8  --   HGB 11.4* 11.5* 9.3* 11.6* 9.8*  HCT 35.0* 35.4* 27.3* 36.6 29.6*  MCV 91.9 92.4 89.8 93.4 91.6  PLT 193 162 146* 238 184   Basic Metabolic Panel: Recent Labs  Lab 10/30/22 1055 10/30/22 2200 10/31/22 0053 11/02/22 1642 11/03/22 0836  NA 139 135 136 140  --   K 3.9 4.0 3.6 4.0  --   CL 107 105 106 102  --   CO2 25 19* 22 30  --   GLUCOSE 96 99 136* 97  --   BUN 12 8 8 9   --   CREATININE 0.73 0.63 0.61 0.84  --   CALCIUM 8.9 8.7* 8.0* 8.9  --   MG  --   --   --   --  2.1  PHOS  --   --   --   --  4.1    Studies: No results found.  Scheduled Meds:  vitamin C  500 mg Oral Daily   cephALEXin  500 mg Oral Q8H   cyanocobalamin  1,000 mcg Intramuscular Q1200   Followed by   Melene Muller ON 11/06/2022] vitamin B-12  1,000 mcg Oral Daily   docusate sodium  100 mg Oral BID   folic acid  1 mg Oral Daily   iron polysaccharides  150 mg Oral Daily   medroxyPROGESTERone  10 mg Oral BID   pantoprazole (PROTONIX) IV  40 mg Intravenous Q12H   polyethylene glycol  17 g Oral BID   Continuous Infusions: PRN Meds: acetaminophen, alum & mag hydroxide-simeth, bisacodyl, bisacodyl, clonazePAM **OR** clonazepam, HYDROmorphone (DILAUDID) injection, menthol-cetylpyridinium,  ondansetron **OR** ondansetron (ZOFRAN) IV, oxyCODONE, simethicone, sodium phosphate  Time spent: 55 minutes  Author: Gillis Santa. MD [facility] 11/03/2022 1:24 PM  To reach On-call, see care teams to locate the attending and reach out to them via [email or web address]. If 7PM-7AM, please contact night-coverage If you still have difficulty reaching the attending provider, please page the [facility] (Director on Call) for [facility] on amion for assistance.

## 2022-11-03 NOTE — H&P (View-Only) (Signed)
Inpatient Consultation   Patient ID: Amy Salas is a 32 y.o. female.  Requesting Provider: Doree Albee, MD  Date of Admission: 10/30/2022  Date of Consult: 11/03/22   Reason for Consultation: abdominal pain   Patient's Chief Complaint:   Chief Complaint  Patient presents with   Abdominal Pain    32 year old Caucasian female with POTS, PTSD, hemorrhagic ovarian cyst status post recent laparoscopic salpingo-oophorectomy and cystectomy who presented to the hospital with worsening abdominal pain and increased vaginal bleeding postintervention  Prior to her procedure the patient was taking 800 mg of ibuprofen twice a day for her discomfort.  She has previously had gastritis noted on EGD in 2022 in [place].  She had not been taking her PPI as of late.  She has similar episode after her cholecystectomy a few years ago as well.  The patient notes that she has lost approximately 20 pounds in 8 weeks relating to the abdominal discomfort and food aversion secondary to this.  She has occasional difficulty swallowing solids like meats, but no issues with pills or liquids.  No odynophagia.  She does not have any current signs of GI bleeding denying hematochezia melena hematemesis and coffee-ground emesis.  CT was performed with findings as below.  CMP within normal limits.  Status postcholecystectomy and appendectomy Denies Anti-plt agents, and anticoagulants Brother with history of colon cancer around age 26 Denies other family history of gastrointestinal disease and malignancy Previous Endoscopies: 11/2020 EGD performed in [place].  Gastritis was demonstrated.  Unknown biopsy results.  A 2 cm hiatal hernia was noted and normal duodenum    Past Medical History:  Diagnosis Date   Ovarian cyst    POTS (postural orthostatic tachycardia syndrome)    PTSD (post-traumatic stress disorder)    Seizure (HCC)     Past Surgical History:  Procedure Laterality Date    CHOLECYSTECTOMY     INGUINAL HERNIA REPAIR Bilateral    LAPAROSCOPIC APPENDECTOMY N/A 10/23/2020   Procedure: APPENDECTOMY LAPAROSCOPIC;  Surgeon: Leafy Ro, MD;  Location: [facility] OR[facility];  Service: General;  Laterality: N/A;   ROBOTIC ASSISTED LAPAROSCOPIC OVARIAN CYSTECTOMY Left 10/30/2022   Procedure: XI ROBOTIC ASSISTED LAPAROSCOPIC OVARIAN CYSTECTOMY, DIAGNOSTIC LAPAROSCOPY;  Surgeon: Christeen Douglas, MD;  Location: [facility] OR[facility];  Service: Gynecology;  Laterality: Left;   ROBOTIC ASSISTED SALPINGO OOPHERECTOMY Left 10/30/2022   Procedure: XI ROBOTIC ASSISTED SALPINGO OOPHORECTOMY;  Surgeon: Christeen Douglas, MD;  Location: [facility];  Service: Gynecology;  Laterality: Left;    Allergies  Allergen Reactions   Bactrim [Sulfamethoxazole-Trimethoprim] Other (See Comments)    seizure   Sulfa Antibiotics     seizures    History reviewed. No pertinent family history.  Social History   Tobacco Use   Smoking status: Never   Smokeless tobacco: Never     Pertinent GI related history and allergies were reviewed with the patient  Review of Systems  Constitutional:  Positive for appetite change and unexpected weight change. Negative for activity change, chills, diaphoresis, fatigue and fever.  HENT:  Positive for trouble swallowing. Negative for voice change.   Respiratory:  Negative for shortness of breath and wheezing.   Cardiovascular:  Negative for chest pain, palpitations and leg swelling.  Gastrointestinal:  Positive for abdominal pain, constipation and nausea. Negative for abdominal distention, anal bleeding, blood in stool, diarrhea, rectal pain and vomiting.  Musculoskeletal:  Negative for arthralgias and myalgias.  Skin:  Negative for color change and pallor.  Neurological:  Negative for dizziness,  syncope and weakness.  Psychiatric/Behavioral:  Negative for confusion.   All other systems reviewed and are negative.    Medications Home Medications No current facility-administered  medications on file prior to encounter.   Current Outpatient Medications on File Prior to Encounter  Medication Sig Dispense Refill   cephALEXin (KEFLEX) 500 MG capsule Take 1 capsule (500 mg total) by mouth 3 (three) times daily. 30 capsule 0   ibuprofen (ADVIL) 600 MG tablet Take 1 tablet (600 mg total) by mouth every 6 (six) hours as needed. 30 tablet 0   ondansetron (ZOFRAN-ODT) 8 MG disintegrating tablet Take 1 tablet (8 mg total) by mouth every 8 (eight) hours as needed for nausea or vomiting. 20 tablet 0   oxyCODONE (OXY IR/ROXICODONE) 5 MG immediate release tablet Take 1 tablet (5 mg total) by mouth every 4 (four) hours as needed for severe pain or breakthrough pain. 30 tablet 0   oxyCODONE-acetaminophen (PERCOCET) 5-325 MG tablet Take 1 tablet by mouth every 8 (eight) hours as needed for severe pain. 15 tablet 0   valACYclovir (VALTREX) 500 MG tablet Take 500 mg by mouth 2 (two) times daily as needed.     Pertinent GI related medications were reviewed with the patient  Inpatient Medications  Current Facility-Administered Medications:    acetaminophen (TYLENOL) tablet 650 mg, 650 mg, Oral, Q6H PRN, Christeen Douglas, MD, 650 mg at 11/03/22 1155   alum & mag hydroxide-simeth (MAALOX/MYLANTA) 200-200-20 MG/5ML suspension 30 mL, 30 mL, Oral, Q4H PRN, Christeen Douglas, MD   ascorbic acid (VITAMIN C) tablet 500 mg, 500 mg, Oral, Daily, Gillis Santa, MD   bisacodyl (DULCOLAX) EC tablet 10 mg, 10 mg, Oral, Daily PRN, Gillis Santa, MD   bisacodyl (DULCOLAX) suppository 10 mg, 10 mg, Rectal, Daily PRN, Gillis Santa, MD   cephALEXin (KEFLEX) capsule 500 mg, 500 mg, Oral, Q8H, Gillis Santa, MD, 500 mg at 11/03/22 1009   clonazePAM (KLONOPIN) tablet 1 mg, 1 mg, Oral, BID PRN, 1 mg at 11/03/22 0647 **OR** clonazePAM (KLONOPIN) disintegrating tablet 1 mg, 1 mg, Oral, BID PRN, Coulter, Carolyn, RPH   cyanocobalamin (VITAMIN B12) injection 1,000 mcg, 1,000 mcg, Intramuscular, Q1200 **FOLLOWED BY**  [START ON 11/06/2022] cyanocobalamin (VITAMIN B12) tablet 1,000 mcg, 1,000 mcg, Oral, Daily, Lucianne Muss, Dileep, MD   docusate sodium (COLACE) capsule 100 mg, 100 mg, Oral, BID, Christeen Douglas, MD, 100 mg at 11/03/22 1009   folic acid (FOLVITE) tablet 1 mg, 1 mg, Oral, Daily, Gillis Santa, MD   HYDROmorphone (DILAUDID) injection 1-2 mg, 1-2 mg, Intravenous, Q3H PRN, Christeen Douglas, MD, 2 mg at 11/03/22 4034   iron polysaccharides (NIFEREX) capsule 150 mg, 150 mg, Oral, Daily, Gillis Santa, MD   medroxyPROGESTERone (PROVERA) tablet 10 mg, 10 mg, Oral, BID, Christeen Douglas, MD, 10 mg at 11/03/22 1009   menthol-cetylpyridinium (CEPACOL) lozenge 3 mg, 1 lozenge, Oral, Q2H PRN, Christeen Douglas, MD   ondansetron [facility]) tablet 4 mg, 4 mg, Oral, Q6H PRN, 4 mg at 10/31/22 1259 **OR** ondansetron (ZOFRAN) injection 4 mg, 4 mg, Intravenous, Q6H PRN, Christeen Douglas, MD, 4 mg at 11/03/22 7425   oxyCODONE (Oxy IR/ROXICODONE) immediate release tablet 5-10 mg, 5-10 mg, Oral, Q4H PRN, Christeen Douglas, MD, 10 mg at 11/03/22 1154   pantoprazole (PROTONIX) injection 40 mg, 40 mg, Intravenous, Q12H, Floydene Flock, MD, 40 mg at 11/03/22 1009   polyethylene glycol (MIRALAX / GLYCOLAX) packet 17 g, 17 g, Oral, BID, Lynden Oxford R, PA-C   simethicone (MYLICON) chewable tablet 80 mg, 80 mg,  Oral, QID PRN, Christeen Douglas, MD, 80 mg at 10/31/22 2035   sodium phosphate (FLEET) enema 1 enema, 1 enema, Rectal, Daily PRN, Christeen Douglas, MD, 1 enema at 10/31/22 2212   acetaminophen, alum & mag hydroxide-simeth, bisacodyl, bisacodyl, clonazePAM **OR** clonazepam, HYDROmorphone (DILAUDID) injection, menthol-cetylpyridinium, ondansetron **OR** ondansetron (ZOFRAN) IV, oxyCODONE, simethicone, sodium phosphate   Objective   Vitals:   11/02/22 1821 11/02/22 1826 11/02/22 2307 11/03/22 0824  BP: (!) 95/59 104/62 108/66 102/65  Pulse:   (!) 59 61  Resp:   16 18  Temp:   98 F (36.7 C) 98.1 F (36.7 C)  TempSrc:       SpO2:   100% 98%  Weight:      Height:         Physical Exam Vitals and nursing note reviewed.  Constitutional:      General: She is not in acute distress.    Appearance: She is not ill-appearing, toxic-appearing or diaphoretic.  HENT:     Head: Normocephalic and atraumatic.     Nose: Nose normal.     Mouth/Throat:     Mouth: Mucous membranes are moist.     Pharynx: Oropharynx is clear.  Eyes:     General: No scleral icterus.    Extraocular Movements: Extraocular movements intact.  Cardiovascular:     Rate and Rhythm: Normal rate and regular rhythm.     Heart sounds: Normal heart sounds. No murmur heard.    No friction rub. No gallop.  Pulmonary:     Effort: Pulmonary effort is normal. No respiratory distress.     Breath sounds: Normal breath sounds. No wheezing, rhonchi or rales.  Abdominal:     General: Bowel sounds are normal. There is no distension.     Palpations: Abdomen is soft.     Tenderness: There is abdominal tenderness (mild; non peritoneal). There is no guarding or rebound.     Comments: Incision sites tender. CDI.  Musculoskeletal:     Cervical back: Neck supple.     Right lower leg: No edema.     Left lower leg: No edema.  Skin:    General: Skin is warm and dry.     Coloration: Skin is not jaundiced or pale.  Neurological:     General: No focal deficit present.     Mental Status: She is alert and oriented to person, place, and time. Mental status is at baseline.  Psychiatric:        Mood and Affect: Mood normal.        Behavior: Behavior normal.        Thought Content: Thought content normal.        Judgment: Judgment normal.     Laboratory Data Recent Labs  Lab 10/31/22 0053 11/02/22 1642 11/03/22 0836  WBC 7.5 8.6 4.0  HGB 9.3* 11.6* 9.8*  HCT 27.3* 36.6 29.6*  PLT 146* 238 184   Recent Labs  Lab 10/30/22 1055 10/30/22 2200 10/31/22 0053 11/02/22 1642  NA 139 135 136 140  K 3.9 4.0 3.6 4.0  CL 107 105 106 102  CO2 25 19* 22 30   BUN 12 8 8 9   CALCIUM 8.9 8.7* 8.0* 8.9  PROT 6.8  --   --  7.0  BILITOT 0.7  --   --  0.6  ALKPHOS 40  --   --  48  ALT 14  --   --  20  AST 15  --   --  23  GLUCOSE 96 99 136* 97   No results for input(s): "INR" in the last 168 hours.  No results for input(s): "LIPASE" in the last 72 hours.      Imaging Studies: CT abdomen pelvis with contrast 10/31/22 FINDINGS: Lower chest: Mild platelike atelectasis noted within the lung bases. No pleural fluid or consolidative change.   Hepatobiliary: No suspicious liver abnormality. The gallbladder is either decompressed or surgically absent. No bile duct dilatation.   Pancreas: Unremarkable. No pancreatic ductal dilatation or surrounding inflammatory changes.   Spleen: Normal in size without focal abnormality.   Adrenals/Urinary Tract: Normal adrenal glands. No nephrolithiasis or signs of obstructive uropathy. Too small to characterize right kidney cysts measure up to 6 mm compatible with Bosniak class 2 lesions. No follow-up imaging recommended. No suspicious bladder abnormality. Small amount of gas noted within the non dependent bladder lumen which likely reflects recent instrumentation.   Stomach/Bowel: Moderate distension the gastric lumen with enteric contrast material. The appendix is not visualized. Postsurgical change at the cecal base is favored to represent prior appendectomy. No bowel wall thickening, inflammation or distension.   Vascular/Lymphatic: No significant vascular findings are present. No enlarged abdominal or pelvic lymph nodes.   Reproductive: The uterus appears normal. Postoperative changes within the pelvis compatible with salpingo-oophorectomy.   Other: There is a small volume of free fluid noted within the dependent portion of the pelvis, image 61/2. No focal fluid collections identified to suggest abscess. New scratch there is pneumoperitoneum identified. This is within normal limits for  postop day 1 status post laparoscopic surgery.   The visualized osseous structures appear normal.   Musculoskeletal: No acute or significant osseous findings.   IMPRESSION: 1. Postoperative changes compatible with salpingo-oophorectomy from the previous day. 2. Small volume of free fluid noted within the dependent portion of the pelvis. Compatible with recent postoperative change. 3. No signs of abscess or bowel obstruction.  Assessment:   # Epigastric pain and nausea  # occasional dysphagia- solids - hiatal hernia noted in 2022  # hemorrhagic ovarian cyst- s/p intervention - being followed by gynecology   # acute on chronic anemia  # weight loss- 20 lbs in 8 weeks per patient  # heavy nsaid use - 800mg  bid for ~2 weeks pre op  # biliary colic s/p cholecystectomy   # gastritis  # fhx crc- brother ~age 76  Plan:  Esophagogastroduodenoscopy planned for tomorrow pending patient stability and endoscopy suite availability Labs reviewed; ct reviewed; no acute findings on ct NPO at midnight liquids diet now Labs in am- bmp, cbc Protonix 40 mg iv q12 h Hold dvt ppx Monitor H&H.  Transfusion and resuscitation as per primary team; no current signs of gib Avoid frequent lab draws to prevent lab induced anemia Supportive care and antiemetics as per primary team Maintain two sites IV access Avoid nsaids Monitor for GIB.  Needs colonoscopy as an outpatient given her brothers history after she recovers from her recent surgery  Esophagogastroduodenoscopy with possible biopsy, control of bleeding, polypectomy, and interventions as necessary has been discussed with the patient/patient representative. Informed consent was obtained from the patient/patient representative after explaining the indication, nature, and risks of the procedure including but not limited to death, bleeding, perforation, missed neoplasm/lesions, cardiorespiratory compromise, and reaction to medications.  Opportunity for questions was given and appropriate answers were provided. Patient/patient representative has verbalized understanding is amenable to undergoing the procedure.  I personally performed the service.  Management of other medical comorbidities as per primary  team  Thank you for allowing [place] to participate in this patient's care. Please don't hesitate to call if any questions or concerns arise.   [name], DO [facility]  Portions of the record may have been created with voice recognition software. Occasional wrong-word or 'sound-a-like' substitutions may have occurred due to the inherent limitations of voice recognition software.  Read the chart carefully and recognize, using context, where substitutions may have occurred.

## 2022-11-03 NOTE — Progress Notes (Signed)
Obstetric and Gynecology  Subjective  Amy Salas is a 32 y.o. female G1P1001 who presented on 10/30/2022 for abdominal/pelvic pain localized to the left lower pelvis, found to have a ruptured hemorrhagic ovarian cyst and fallopian tube.   Hx of POTs, lap appy and lap chole. FHx of ovarian cancer in 22yo cousin.  S/p 4 Days Post-Op Procedure(s): XI ROBOTIC ASSISTED LAPAROSCOPIC OVARIAN CYSTECTOMY, DIAGNOSTIC LAPAROSCOPY XI ROBOTIC ASSISTED SALPINGO OOPHORECTOMY EVACUATION OF HEMOPERITONEUM  Postop course has been slow and did not relieve her pain. No acute peritoneal sx.  She reports her pain related to the cyst has improved.  States that she feels sore around the the incision sites but "feels similar to when I had my appendix out".  Acute pain is located mainly mid-epigastric area.  She reports a history of hernia and potential tear and states the pain feels similar. She endorses nausea with every meal controlled with zofran. She describes 20-30 lb weight loss in the last 6-8 weeks with similar nausea and some vomiting. Endorses mild dysuria.  Reports she started having vaginal bleeding yesterday.  Heavier than her usual menstrual cycle with several small clots, largest being the size of a golf ball.  She's had to change about 1 pad every 1-2 hours. Her menstrual cycles are typically lighter.   CT scan 2 days ago was remarkable for moderate distension of the gastric lumen with enteric contrast material. No obvious hemoperitoneum, bowel injury, free air, or focal fluid collections to suggest an abscess.  Changes were compatible with recent surgery.   Urine output adequate, is tolerating regular diet in small amounts.  Enema 2 days ago successful, with another more formal bowel movement yesterday morning.   S/p general surgery consult completed, concurring with above and encouraging bowel regimen.  Objective   Vitals:   11/02/22 2307 11/03/22 0824  BP: 108/66 102/65  Pulse:  (!) 59 61  Resp: 16 18  Temp: 98 F (36.7 C) 98.1 F (36.7 C)  SpO2: 100% 98%     Intake/Output Summary (Last 24 hours) at 11/03/2022 1026 Last data filed at 11/03/2022 0900 Gross per 24 hour  Intake 1312.26 ml  Output --  Net 1312.26 ml    General: NAD Cardiovascular: RRR Pulmonary: CTAB Abdomen: Benign. mildly-tender throughout, +BS, +voluntary guarding. Incisions c/d/I and covered. Extremities: No erythema or cords, no calf tenderness, +warmth with normal peripheral pulses.  Labs: Results for orders placed or performed during the hospital encounter of 10/30/22 (from the past 24 hour(s))  Technologist smear review     Status: None   Collection Time: 11/02/22  4:42 PM  Result Value Ref Range   WBC MORPHOLOGY MORPHOLOGY UNREMARKABLE    RBC MORPHOLOGY MORPHOLOGY UNREMARKABLE    Plt Morphology Normal platelet morphology    Clinical Information thrombocytopenia   CBC with Differential/Platelet     Status: Abnormal   Collection Time: 11/02/22  4:42 PM  Result Value Ref Range   WBC 8.6 4.0 - 10.5 K/uL   RBC 3.92 3.87 - 5.11 MIL/uL   Hemoglobin 11.6 (L) 12.0 - 15.0 g/dL   HCT 16.1 09.6 - 04.5 %   MCV 93.4 80.0 - 100.0 fL   MCH 29.6 26.0 - 34.0 pg   MCHC 31.7 30.0 - 36.0 g/dL   RDW 40.9 81.1 - 91.4 %   Platelets 238 150 - 400 K/uL   nRBC 0.0 0.0 - 0.2 %   Neutrophils Relative % 69 %   Neutro Abs 5.8 1.7 - 7.7 K/uL  Lymphocytes Relative 25 %   Lymphs Abs 2.2 0.7 - 4.0 K/uL   Monocytes Relative 5 %   Monocytes Absolute 0.5 0.1 - 1.0 K/uL   Eosinophils Relative 1 %   Eosinophils Absolute 0.1 0.0 - 0.5 K/uL   Basophils Relative 0 %   Basophils Absolute 0.0 0.0 - 0.1 K/uL   Immature Granulocytes 0 %   Abs Immature Granulocytes 0.02 0.00 - 0.07 K/uL  Comprehensive metabolic panel     Status: None   Collection Time: 11/02/22  4:42 PM  Result Value Ref Range   Sodium 140 135 - 145 mmol/L   Potassium 4.0 3.5 - 5.1 mmol/L   Chloride 102 98 - 111 mmol/L   CO2 30 22 - 32  mmol/L   Glucose, Bld 97 70 - 99 mg/dL   BUN 9 6 - 20 mg/dL   Creatinine, Ser 0.98 0.44 - 1.00 mg/dL   Calcium 8.9 8.9 - 11.9 mg/dL   Total Protein 7.0 6.5 - 8.1 g/dL   Albumin 4.2 3.5 - 5.0 g/dL   AST 23 15 - 41 U/L   ALT 20 0 - 44 U/L   Alkaline Phosphatase 48 38 - 126 U/L   Total Bilirubin 0.6 0.3 - 1.2 mg/dL   GFR, Estimated >14 >78 mL/min   Anion gap 8 5 - 15  Type and screen [facility]     Status: None   Collection Time: 11/02/22  4:42 PM  Result Value Ref Range   ABO/RH(D) O POS    Antibody Screen NEG    Sample Expiration      11/05/2022,2359 Performed at [facility], [address] [place], [place] [address]   CBC     Status: Abnormal   Collection Time: 11/03/22  8:36 AM  Result Value Ref Range   WBC 4.0 4.0 - 10.5 K/uL   RBC 3.23 (L) 3.87 - 5.11 MIL/uL   Hemoglobin 9.8 (L) 12.0 - 15.0 g/dL   HCT 13.0 (L) 86.5 - 78.4 %   MCV 91.6 80.0 - 100.0 fL   MCH 30.3 26.0 - 34.0 pg   MCHC 33.1 30.0 - 36.0 g/dL   RDW 69.6 29.5 - 28.4 %   Platelets 184 150 - 400 K/uL   nRBC 0.0 0.0 - 0.2 %  Magnesium     Status: None   Collection Time: 11/03/22  8:36 AM  Result Value Ref Range   Magnesium 2.1 1.7 - 2.4 mg/dL  Phosphorus     Status: None   Collection Time: 11/03/22  8:36 AM  Result Value Ref Range   Phosphorus 4.1 2.5 - 4.6 mg/dL  Iron and TIBC     Status: Abnormal   Collection Time: 11/03/22  8:36 AM  Result Value Ref Range   Iron 26 (L) 28 - 170 ug/dL   TIBC 132 (L) 440 - 102 ug/dL   Saturation Ratios 12 10.4 - 31.8 %   UIBC 184 ug/dL  Folate     Status: Abnormal   Collection Time: 11/03/22  8:36 AM  Result Value Ref Range   Folate 5.1 (L) >5.9 ng/mL    Cultures: Results for orders placed or performed during the hospital encounter of 10/30/22  Urine Culture     Status: Abnormal   Collection Time: 11/01/22 10:16 AM   Specimen: Urine, Clean Catch  Result Value Ref Range Status   Specimen Description   Final    URINE, CLEAN  CATCH Performed at [facility],  [address] [place], [place] [address]    Special Requests   Final    NONE Performed at [facility], [address] [place], [place] [address]    Culture >=100,000 COLONIES/mL ESCHERICHIA COLI (A)  Final   Report Status 11/03/2022 FINAL  Final   Organism ID, Bacteria ESCHERICHIA COLI (A)  Final      Susceptibility   Escherichia coli - MIC*    AMPICILLIN >=32 RESISTANT Resistant     CEFAZOLIN <=4 SENSITIVE Sensitive     CEFEPIME <=0.12 SENSITIVE Sensitive     CEFTRIAXONE <=0.25 SENSITIVE Sensitive     CIPROFLOXACIN <=0.25 SENSITIVE Sensitive     GENTAMICIN <=1 SENSITIVE Sensitive     IMIPENEM <=0.25 SENSITIVE Sensitive     NITROFURANTOIN 64 INTERMEDIATE Intermediate     TRIMETH/SULFA <=20 SENSITIVE Sensitive     AMPICILLIN/SULBACTAM 16 INTERMEDIATE Intermediate     PIP/TAZO <=4 SENSITIVE Sensitive     * >=100,000 COLONIES/mL ESCHERICHIA COLI    Imaging: CT ABDOMEN PELVIS W CONTRAST  Result Date: 11/01/2022 CLINICAL DATA:  One day postop. Status post robotic assisted laparoscopic salpingo-oophorectomy. Patient complains of abdominal pain. EXAM: CT ABDOMEN AND PELVIS WITH CONTRAST TECHNIQUE: Multidetector CT imaging of the abdomen and pelvis was performed using the standard protocol following bolus administration of intravenous contrast. RADIATION DOSE REDUCTION: This exam was performed according to the departmental dose-optimization program which includes automated exposure control, adjustment of the mA and/or kV according to patient size and/or use of iterative reconstruction technique. CONTRAST:  75mL OMNIPAQUE IOHEXOL 300 MG/ML  SOLN COMPARISON:  12/27/2020. FINDINGS: Lower chest: Mild platelike atelectasis noted within the lung bases. No pleural fluid or consolidative change. Hepatobiliary: No suspicious liver abnormality. The gallbladder is either decompressed or surgically absent. No bile duct dilatation. Pancreas:  Unremarkable. No pancreatic ductal dilatation or surrounding inflammatory changes. Spleen: Normal in size without focal abnormality. Adrenals/Urinary Tract: Normal adrenal glands. No nephrolithiasis or signs of obstructive uropathy. Too small to characterize right kidney cysts measure up to 6 mm compatible with Bosniak class 2 lesions. No follow-up imaging recommended. No suspicious bladder abnormality. Small amount of gas noted within the non dependent bladder lumen which likely reflects recent instrumentation. Stomach/Bowel: Moderate distension the gastric lumen with enteric contrast material. The appendix is not visualized. Postsurgical change at the cecal base is favored to represent prior appendectomy. No bowel wall thickening, inflammation or distension. Vascular/Lymphatic: No significant vascular findings are present. No enlarged abdominal or pelvic lymph nodes. Reproductive: The uterus appears normal. Postoperative changes within the pelvis compatible with salpingo-oophorectomy. Other: There is a small volume of free fluid noted within the dependent portion of the pelvis, image 61/2. No focal fluid collections identified to suggest abscess. New scratch there is pneumoperitoneum identified. This is within normal limits for postop day 1 status post laparoscopic surgery. The visualized osseous structures appear normal. Musculoskeletal: No acute or significant osseous findings. IMPRESSION: 1. Postoperative changes compatible with salpingo-oophorectomy from the previous day. 2. Small volume of free fluid noted within the dependent portion of the pelvis. Compatible with recent postoperative change. 3. No signs of abscess or bowel obstruction. Electronically Signed   By: Signa Kell M.D.   On: 11/01/2022 10:06   US PELVIC COMPLETE W TRANSVAGINAL AND TORSION R/O  Result Date: 10/30/2022 CLINICAL DATA:  Abdominal pain for 2 weeks EXAM: TRANSABDOMINAL AND TRANSVAGINAL ULTRASOUND OF PELVIS DOPPLER ULTRASOUND OF  OVARIES TECHNIQUE: Both transabdominal and transvaginal ultrasound examinations of the pelvis were performed. Transabdominal technique was performed  for global imaging of the pelvis including uterus, ovaries, adnexal regions, and pelvic cul-de-sac. It was necessary to proceed with endovaginal exam following the transabdominal exam to visualize the adnexa and endometrium. Color and duplex Doppler ultrasound was utilized to evaluate blood flow to the ovaries. COMPARISON:  Ultrasound 10/25/2022 and older. FINDINGS: Uterus Measurements: 8.8 x 4.2 x 5.3 cm = volume: 102 mL. No fibroids or other mass visualized. Prominent uterine vessels. Endometrium Thickness: 10 mm.  No focal abnormality visualized. Right ovary Measurements: 4.0 x 2.3 x 2.4 cm = volume: 12 mL. Normal appearance/no adnexal mass. Left ovary Measurements: 5.1 x 3.4 x 3.6 cm = volume: 32 mL. There is complex hypoechoic area in the left ovary measuring 2.7 x 2.8 x 2.6 cm today previously similar size focus was seen measuring 2.5 x 2.2 x 2.4 cm. Few more internal echoes in the structure no discrete abnormal blood flow within this structure. Pulsed Doppler evaluation of both ovaries demonstrates normal low-resistance arterial and venous waveforms. Other findings Small amount of free fluid in the pelvis. IMPRESSION: 2.8 cm complex cystic structure in the left ovary. This could be an evolving hemorrhagic or functional cyst based on overall appearance. Of note on older exam from 10/23/2022 left ovarian lesion was measured at 4.3 cm. Preserved blood flow to the left ovary. Trace free fluid in the pelvis. With a persistent pain would recommend follow up imaging in 6-12 weeks to confirm resolution. Electronically Signed   By: Karen Kays M.D.   On: 10/30/2022 13:37   US PELVIC COMPLETE W TRANSVAGINAL AND TORSION R/O  Result Date: 10/25/2022 CLINICAL DATA:  Pelvic pain beginning this morning. EXAM: TRANSABDOMINAL AND TRANSVAGINAL ULTRASOUND OF PELVIS DOPPLER  ULTRASOUND OF OVARIES TECHNIQUE: Both transabdominal and transvaginal ultrasound examinations of the pelvis were performed. Transabdominal technique was performed for global imaging of the pelvis including uterus, ovaries, adnexal regions, and pelvic cul-de-sac. It was necessary to proceed with endovaginal exam following the transabdominal exam to visualize the endometrium and adnexa. Color and duplex Doppler ultrasound was utilized to evaluate blood flow to the ovaries. COMPARISON:  Pelvic ultrasound 10/23/2022 FINDINGS: Uterus Measurements: 8.0 x 4.1 x 5.0 cm = volume: 86.5 mL. No fibroids or other mass visualized. Endometrium Thickness: 7 mm, within normal limits. No focal abnormality visualized. Right ovary Measurements: 2.8 x 2.3 x 2.0 cm = volume: 7.0 mL. Normal appearance/no adnexal mass. Left ovary Measurements: 4.3 x 3.6 x 2.8 cm = volume: 22.4 mL. The previously seen echogenic structure has changed. A hypoechoic lesion is now present, measuring 2.5 x 2.2 x 2.4 cm. This likely represents a broad treated cyst. Pulsed Doppler evaluation of both ovaries demonstrates normal low-resistance arterial and venous waveforms. Other findings Trace free fluid is present. IMPRESSION: 1. The previously seen echogenic structure in the left ovary has changed. A hypoechoic lesion is now present, measuring 2.5 x 2.2 x 2.4 cm. This likely represents a hemorrhagic cyst. 2. Trace free fluid in the pelvis is likely physiologic. 3. Normal appearance of the uterus and right ovary. Electronically Signed   By: Marin Roberts M.D.   On: 10/25/2022 10:47   US PELVIC COMPLETE W TRANSVAGINAL AND TORSION R/O  Result Date: 10/23/2022 CLINICAL DATA:  Sharp left lower quadrant pain EXAM: TRANSABDOMINAL AND TRANSVAGINAL ULTRASOUND OF PELVIS DOPPLER ULTRASOUND OF OVARIES TECHNIQUE: Both transabdominal and transvaginal ultrasound examinations of the pelvis were performed. Transabdominal technique was performed for global imaging of  the pelvis including uterus, ovaries, adnexal regions, and pelvic cul-de-sac. It was  necessary to proceed with endovaginal exam following the transabdominal exam to visualize the ovaries. Color and duplex Doppler ultrasound was utilized to evaluate blood flow to the ovaries. COMPARISON:  None Available. FINDINGS: Uterus Measurements: 9.8 cm in sagittal dimension. No fibroids or other mass visualized. Endometrium Thickness: 9.  No focal abnormality visualized. Right ovary Measurements: 3.4 x 2.8 x 1.9 cm = volume: 9.7 mL. Normal appearance. No adnexal mass. Left ovary Measurements: 5.1 x 4.8 x 3.9 cm = volume: 50.9 mL. Contains a heterogeneously echogenic structure measuring 4.3 x 4.2 x 3.4 cm. No definite internal vascularity. Pulsed Doppler evaluation of both ovaries demonstrates normal low-resistance arterial and venous waveforms in the right ovary and within the peripheral ovarian stroma in the left ovary. Other findings No abnormal free fluid. IMPRESSION: 1. Heterogeneously echogenic structure in the left ovary measuring up to 4.3 cm without appreciable central vascularity, which may represent a hemorrhagic cyst or endometrioma. Consider repeat ultrasound examination in 6 weeks to ensure resolution. 2. No current finding of ovarian torsion. Recommend repeat ultrasound examination with Doppler if there is acute worsening of symptoms as the asymmetrically enlarged size of the left ovary may act as a lead point for torsion. Electronically Signed   By: Agustin Cree M.D.   On: 10/23/2022 10:39     Assessment   32 y.o. G1P1001 Hospital Day: 5   Plan  4 Days Post-Op Procedure(s): XI ROBOTIC ASSISTED LAPAROSCOPIC OVARIAN CYSTECTOMY, DIAGNOSTIC LAPAROSCOPY XI ROBOTIC ASSISTED SALPINGO OOPHORECTOMY EVACUATION OF HEMOPERITONEUM  - New onset vaginal bleeding: Expected withdrawal bleeding d/t removal of her luteal cyst in surgery. It sounds heavier and more painful than a normal period, but is consistent with an  expected hormonally trigger withdrawal bleed - Anemia: will review AM labs with Dr. Dalbert Garnet.  Overall clinically stable and asymptomatic.  Consider blood transfusion transfusion for hemoglobin < 7 or iv iron PRN. Given hx of POTS, her BP is at baseline, and she is not tachycardic.    - Postop:    - NSAIDs held, tylenol and dilaudid PRN. Will tansistion to po meds in anticipation of home management     - s/p daily PT to assist with ambulation. SCDs ordered until fully ambulatory. With a short, minimally invasive surgery without malignancy, will hold starting LMWH at this time.   - Back and mid-epigastric pain: hospitalist consult completed. Stopped NSAIDs and started PPI/bowel regimen.  Heating pad in use. GI consult requested for today as her symptoms have not resolved.    - Dysuria- UA negative - urine culture positive for E. Coli. Started on Keflex.  - PPx- IS ordered - constipation- improving. No black or green stools. BM yesterday and today  - FHx of ovarian cancer: Plan for BRCA and HNPCC testing as outpatient. Will keep already scheduled appointment in our office.  -Clinically improving since admission.  Will plan to complete consult with GI today.  Potential discharge later this evening and further outpatient management.    Amy Salas, CNM Certified Nurse Midwife [facility]

## 2022-11-04 ENCOUNTER — Inpatient Hospital Stay: Payer: Medicaid Other | Admitting: Anesthesiology

## 2022-11-04 ENCOUNTER — Encounter
Admission: EM | Disposition: A | Discharge status: Home / Self Care | Payer: Self-pay | Source: Home / Self Care | Attending: Obstetrics and Gynecology

## 2022-11-04 ENCOUNTER — Encounter: Payer: Self-pay | Admitting: Obstetrics and Gynecology

## 2022-11-04 DIAGNOSIS — N83209 Unspecified ovarian cyst, unspecified side: Secondary | ICD-10-CM | POA: Diagnosis not present

## 2022-11-04 HISTORY — PX: ESOPHAGOGASTRODUODENOSCOPY (EGD) WITH PROPOFOL: SHX5813

## 2022-11-04 HISTORY — PX: BIOPSY: SHX5522

## 2022-11-04 LAB — CBC
HCT: 31.5 % — ABNORMAL LOW (ref 36.0–46.0)
Hemoglobin: 10.6 g/dL — ABNORMAL LOW (ref 12.0–15.0)
MCH: 30.3 pg (ref 26.0–34.0)
MCHC: 33.7 g/dL (ref 30.0–36.0)
MCV: 90 fL (ref 80.0–100.0)
Platelets: 210 10*3/uL (ref 150–400)
RBC: 3.5 MIL/uL — ABNORMAL LOW (ref 3.87–5.11)
RDW: 13.9 % (ref 11.5–15.5)
WBC: 4.4 10*3/uL (ref 4.0–10.5)
nRBC: 0 % (ref 0.0–0.2)

## 2022-11-04 LAB — BASIC METABOLIC PANEL
Anion gap: 8 (ref 5–15)
BUN: 10 mg/dL (ref 6–20)
CO2: 26 mmol/L (ref 22–32)
Calcium: 8.5 mg/dL — ABNORMAL LOW (ref 8.9–10.3)
Chloride: 104 mmol/L (ref 98–111)
Creatinine, Ser: 0.7 mg/dL (ref 0.44–1.00)
GFR, Estimated: >60 mL/min (ref >60)
Glucose, Bld: 95 mg/dL (ref 70–99)
Potassium: 3.6 mmol/L (ref 3.5–5.1)
Sodium: 138 mmol/L (ref 135–145)

## 2022-11-04 LAB — MAGNESIUM: Magnesium: 2.3 mg/dL (ref 1.7–2.4)

## 2022-11-04 LAB — PHOSPHORUS: Phosphorus: 4.4 mg/dL (ref 2.5–4.6)

## 2022-11-04 SURGERY — ESOPHAGOGASTRODUODENOSCOPY (EGD) WITH PROPOFOL
Anesthesia: General

## 2022-11-04 MED ORDER — PROPOFOL 10 MG/ML IV BOLUS
INTRAVENOUS | Status: AC
  Filled 2022-11-04: qty 20

## 2022-11-04 MED ORDER — BISACODYL 5 MG PO TBEC
5.0000 mg | DELAYED_RELEASE_TABLET | Freq: Once | ORAL | Status: AC
  Administered 2022-11-04: 5 mg via ORAL
  Filled 2022-11-04: qty 1

## 2022-11-04 MED ORDER — LIDOCAINE HCL (CARDIAC) PF 100 MG/5ML IV SOSY
PREFILLED_SYRINGE | INTRAVENOUS | Status: DC | PRN
  Administered 2022-11-04: 50 mg via INTRAVENOUS

## 2022-11-04 MED ORDER — BISACODYL 5 MG PO TBEC
10.0000 mg | DELAYED_RELEASE_TABLET | Freq: Every day | ORAL | Status: DC
  Filled 2022-11-04: qty 2

## 2022-11-04 MED ORDER — MIDAZOLAM HCL 2 MG/2ML IJ SOLN
INTRAMUSCULAR | Status: DC | PRN
  Administered 2022-11-04: 2 mg via INTRAVENOUS

## 2022-11-04 MED ORDER — SODIUM CHLORIDE 0.9 % IV SOLN: INTRAVENOUS | Status: DC

## 2022-11-04 MED ORDER — PANTOPRAZOLE SODIUM 40 MG PO TBEC
40.0000 mg | DELAYED_RELEASE_TABLET | Freq: Two times a day (BID) | ORAL | Status: DC
  Administered 2022-11-04 – 2022-11-05 (×2): 40 mg via ORAL
  Filled 2022-11-04 (×2): qty 1

## 2022-11-04 MED ORDER — PROPOFOL 10 MG/ML IV BOLUS
INTRAVENOUS | Status: DC | PRN
  Administered 2022-11-04: 70 mg via INTRAVENOUS
  Administered 2022-11-04: 25 mg via INTRAVENOUS
  Administered 2022-11-04 (×2): 30 mg via INTRAVENOUS

## 2022-11-04 MED ORDER — MIDAZOLAM HCL 2 MG/2ML IJ SOLN
INTRAMUSCULAR | Status: AC
  Filled 2022-11-04: qty 2

## 2022-11-04 NOTE — Progress Notes (Signed)
PT Cancellation Note:  Pt currently off floor for procedure.  Will re-attempt PT session at a later date/time.  Hendricks Limes, PT 11/04/22, 1:34 PM

## 2022-11-04 NOTE — Anesthesia Procedure Notes (Signed)
Procedure Name: MAC Date/Time: 11/04/2022 1:28 PM  Performed by: Elisabeth Pigeon, CRNAPre-anesthesia Checklist: Patient identified, Emergency Drugs available, Suction available, Patient being monitored and Timeout performed Patient Re-evaluated:Patient Re-evaluated prior to induction Oxygen Delivery Method: Nasal cannula Induction Type: IV induction

## 2022-11-04 NOTE — Plan of Care (Signed)

## 2022-11-04 NOTE — Care Plan (Signed)
Brief GI Care Plan  EGD today Gastritis noted throughout the stomach. Biopsies performed Appears to have healing/healed ulceration near the pylorus Mild esophagitis.  Biopsies taken for dysphagia, however, esophagus is widely patent without issues. Normal duodenum  Continue twice daily PPI Avoid nsaids Advance diet as tolerated Viscous lidocaine for abdominal pain as needed Can trial mag citrate or lactulose to encourage BM Our office will contact the patient regarding colonoscopy given her family history  GI to sign off. Available as needed. Please do not hesitate to call regarding questions or concerns.  Enis Slipper, DO [facility]

## 2022-11-04 NOTE — Progress Notes (Signed)
[facility] Progress Note  Patient: Amy Salas    JWJ:[id number]  DOA: 10/30/2022     Date of Service: the patient was seen and examined on 11/04/2022  Chief Complaint  Patient presents with   Abdominal Pain   Brief hospital course: Amy Salas is a 32 y.o. female with past medical history of ovarian cyst, POTS syndrome, PTSD seizure disorder presenting to the hospital with recurrent lower abdominal pain with noted hemorrhagic ovarian cyst.  Was initially consulted yesterday with evaluation concerning for gastritis.  Patient started on IV PPI, trial of GI cocktail as well as bowel regimen per general surgery recommendations.  Per Dr. Dalbert Garnet, plan was to discharge patient in the morning with trial of PPI treatment as well as bowel regimen.  Received report about patient having severe lower abdominal pain with severe cramping and menstrual bleeding.  On evaluation, patient states that IV PPI gave some transient improvement though with some still present epigastric pain.  Positive nausea and vomiting.?  Brown versus black emesis per patient.  No black or bloody stools.  No chest pain or shortness of breath.  [facility] was consulted for abdominal pain.  GI was consulted for GERD possible EGD.  Assessment and Plan:  # Ruptured hemorrhagic ovarian cyst and fallopian tube  S/p laparoscopic ovarian cystectomy, salpingo-oophorectomy and evacuation of hemoperitoneum done by OB/GYN Continue as needed medication for pain control Continue Provera 10 mg p.o. twice daily Further management as per primary team Patient is still having vaginal bleeding  # GERD, continue PPI twice daily GI consulted, plan for EGD today. Follow GI for further recommendation.  # UTI, UA positive, urine culture growing E. coli, pansensitive. Started Keflex 500 mg p.o. 3 times daily for 5 days  # Acute blood loss anemia due to vaginal bleeding Hb 10.6 stable Monitor H&H and transfuse if  hemoglobin less than 7  # Iron deficiency, started oral iron supplement with vitamin C # Folic acid deficiency, started folic acid oral supplement # Vitamin B12 level 284, goal >400, started vitamin B12 1000 mcg IM injection daily for 3 days followed by oral supplement.   Body mass index is 17.72 kg/m.  Interventions:  Diet: was on Full liquid diet, NPO now for EGD DVT Prophylaxis: SCD, pharmacological prophylaxis contraindicated due to vaginal bleeding    Advance goals of care discussion: Full code  Family Communication: family was present at bedside, at the time of interview.  The pt provided permission to discuss medical plan with the family. Opportunity was given to ask question and all questions were answered satisfactorily.   Disposition:  Pt is from Home, admitted with ruptured hemorrhagic ovarian cyst, still having significant vaginal bleeding, acute blood loss anemia.  Patient is admitted under OB/GYN [facility] is consulting, we will continue to follow along during her hospital stay.     Subjective: No significant events overnight, patient was complaining of pain in the right lower quadrant 6/10, started 1 to 2-hour ago.  Otherwise she was pain-free.  Denies any nausea vomiting.  Patient is still having vaginal bleeding but very little.  Denied any chest pain or palpitation, no any other complaints.  Physical Exam: General: NAD, lying comfortably Appear in no distress, affect appropriate Eyes: PERRLA ENT: Oral Mucosa Clear, moist  Neck: no JVD,  Cardiovascular: S1 and S2 Present, no Murmur,  Respiratory: good respiratory effort, Bilateral Air entry equal and Decreased, no Crackles, no wheezes Abdomen: Bowel Sound present, Soft and mild RLQ tenderness,  Skin: no  rashes Extremities: no Pedal edema, no calf tenderness Neurologic: without any new focal findings Gait not checked due to patient safety concerns  Vitals:   11/03/22 1759 11/04/22 0047 11/04/22 1006 11/04/22 1238   BP: 105/82 (!) 101/55 101/74 103/61  Pulse: 70 (!) 55 61 61  Resp: 18 18 18 16   Temp: 98.4 F (36.9 C) 97.8 F (36.6 C)  98 F (36.7 C)  TempSrc: Oral   Temporal  SpO2: 100% 99% 99% 100%  Weight:      Height:        Intake/Output Summary (Last 24 hours) at 11/04/2022 1314 Last data filed at 11/03/2022 1900 Gross per 24 hour  Intake 120 ml  Output --  Net 120 ml   Filed Weights   10/30/22 1449 10/30/22 1607  Weight: 55.3 kg 54.4 kg    Data Reviewed: I have personally reviewed and interpreted daily labs, tele strips, imagings as discussed above. I reviewed all nursing notes, pharmacy notes, vitals, pertinent old records I have discussed plan of care as described above with RN and patient/family.  CBC: Recent Labs  Lab 10/30/22 2200 10/31/22 0053 11/02/22 1642 11/03/22 0836 11/03/22 1551 11/04/22 0444  WBC 14.7* 7.5 8.6 4.0  --  4.4  NEUTROABS  --   --  5.8  --   --   --   HGB 11.5* 9.3* 11.6* 9.8* 10.6* 10.6*  HCT 35.4* 27.3* 36.6 29.6* 32.9* 31.5*  MCV 92.4 89.8 93.4 91.6  --  90.0  PLT 162 146* 238 184  --  210   Basic Metabolic Panel: Recent Labs  Lab 10/30/22 1055 10/30/22 2200 10/31/22 0053 11/02/22 1642 11/03/22 0836 11/04/22 0444  NA 139 135 136 140  --  138  K 3.9 4.0 3.6 4.0  --  3.6  CL 107 105 106 102  --  104  CO2 25 19* 22 30  --  26  GLUCOSE 96 99 136* 97  --  95  BUN 12 8 8 9   --  10  CREATININE 0.73 0.63 0.61 0.84  --  0.70  CALCIUM 8.9 8.7* 8.0* 8.9  --  8.5*  MG  --   --   --   --  2.1 2.3  PHOS  --   --   --   --  4.1 4.4    Studies: No results found.  Scheduled Meds:  vitamin C  500 mg Oral Daily   cephALEXin  500 mg Oral Q8H   cyanocobalamin  1,000 mcg Intramuscular Q1200   Followed by   Melene Muller ON 11/06/2022] vitamin B-12  1,000 mcg Oral Daily   docusate sodium  100 mg Oral BID   folic acid  1 mg Oral Daily   iron polysaccharides  150 mg Oral Daily   medroxyPROGESTERone  10 mg Oral BID   pantoprazole (PROTONIX) IV  40 mg  Intravenous Q12H   polyethylene glycol  17 g Oral BID   Continuous Infusions:  sodium chloride 125 mL/hr at 11/04/22 0959   sodium chloride 20 mL/hr at 11/04/22 1243   PRN Meds: acetaminophen, alum & mag hydroxide-simeth, bisacodyl, bisacodyl, clonazePAM **OR** clonazepam, HYDROmorphone (DILAUDID) injection, menthol-cetylpyridinium, ondansetron **OR** ondansetron (ZOFRAN) IV, oxyCODONE, simethicone, sodium phosphate  Time spent: 40 minutes  Author: Gillis Santa. MD [facility] 11/04/2022 1:14 PM  To reach On-call, see care teams to locate the attending and reach out to them via [email or web address]. If 7PM-7AM, please contact night-coverage If you still have difficulty reaching the attending provider,  please page the [facility] (Director on Call) for [facility] on amion for assistance.

## 2022-11-04 NOTE — Interval H&P Note (Signed)
History and Physical Interval Note: Consult note from 11/03/22  was reviewed and there was no interval change after seeing and examining the patient.  Written consent was obtained from the patient after discussion of risks, benefits, and alternatives. Patient has consented to proceed with Esophagogastroduodenoscopy with possible intervention  Esophagogastroduodenoscopy with possible biopsy, control of bleeding, polypectomy, and interventions as necessary has been discussed with the patient/patient representative. Informed consent was obtained from the patient/patient representative after explaining the indication, nature, and risks of the procedure including but not limited to death, bleeding, perforation, missed neoplasm/lesions, cardiorespiratory compromise, and reaction to medications. Opportunity for questions was given and appropriate answers were provided. Patient/patient representative has verbalized understanding is amenable to undergoing the procedure.    11/04/2022 12:33 PM  Amy Salas  has presented today for surgery, with the diagnosis of epigastric pain.  The various methods of treatment have been discussed with the patient and family. After consideration of risks, benefits and other options for treatment, the patient has consented to  Procedure(s): ESOPHAGOGASTRODUODENOSCOPY (EGD) WITH PROPOFOL (N/A) as a surgical intervention.  The patient's history has been reviewed, patient examined, no change in status, stable for surgery.  I have reviewed the patient's chart and labs.  Questions were answered to the patient's satisfaction.     Amy Salas

## 2022-11-04 NOTE — Transfer of Care (Signed)
Immediate Anesthesia Transfer of Care Note  Patient: Amy Salas  Procedure(s) Performed: ESOPHAGOGASTRODUODENOSCOPY (EGD) WITH PROPOFOL  Patient Location: PACU  Anesthesia Type:MAC  Level of Consciousness: drowsy  Airway & Oxygen Therapy: Patient Spontanous Breathing  Post-op Assessment: Report given to RN and Post -op Vital signs reviewed and stable  Post vital signs: Reviewed and stable  Last Vitals:  Vitals Value Taken Time  BP 92/52 11/04/22 1344  Temp 36.4 C 11/04/22 1344  Pulse 59 11/04/22 1347  Resp 15 11/04/22 1347  SpO2 94 % 11/04/22 1347  Vitals shown include unfiled device data.  Last Pain:  Vitals:   11/04/22 1344  TempSrc: Temporal  PainSc: Asleep      Patients Stated Pain Goal: 0 (11/03/22 0446)  Complications: No notable events documented.

## 2022-11-04 NOTE — Anesthesia Preprocedure Evaluation (Addendum)
Anesthesia Evaluation  Patient identified by MRN, date of birth, ID band Patient awake    Reviewed: Allergy & Precautions, H&P , NPO status , Patient's Chart, lab work & pertinent test results  Airway Mallampati: I  TM Distance: >3 FB Neck ROM: full    Dental no notable dental hx.    Pulmonary neg pulmonary ROS   Pulmonary exam normal        Cardiovascular Normal cardiovascular exam  POTS (postural orthostatic tachycardia syndrome   Neuro/Psych Seizures -,  PSYCHIATRIC DISORDERS         GI/Hepatic Neg liver ROS,GERD  ,, hiatal hernia noted in 2022 Gastritis Epigastric pain and nausea   Endo/Other  negative endocrine ROS    Renal/GU negative Renal ROS  negative genitourinary   Musculoskeletal   Abdominal Normal abdominal exam  (+)   Peds  Hematology  (+) Blood dyscrasia, anemia Iron deficiency, started oral iron supplement with vitamin C # Folic acid deficiency, started folic acid oral supplement # Vitamin B12 level 284, goal >400, started vitamin B12 1000 mcg IM injection daily for 3 days followed by oral supplement.  Vaginal Bleeding   Anesthesia Other Findings Pts recent history includes hemorrhagic ovarian cyst status post recent laparoscopic salpingo-oophorectomy and cystectomy 10/30/2022 who presented to the hospital with worsening abdominal pain and increased vaginal bleeding postintervention.  UTI, UA positive, urine culture growing E. coli, pansensitive. Started Keflex 40 mg p.o. 3 times daily for 5 days   Past Medical History: No date: Ovarian cyst No date: POTS (postural orthostatic tachycardia syndrome) No date: PTSD (post-traumatic stress disorder) No date: Seizure [facility])  Past Surgical History: No date: CHOLECYSTECTOMY No date: INGUINAL HERNIA REPAIR; Bilateral 10/23/2020: LAPAROSCOPIC APPENDECTOMY; N/A     Comment:  Procedure: APPENDECTOMY LAPAROSCOPIC;  Surgeon: Leafy Ro,  MD;  Location: [facility] ORS;  Service: General;                Laterality: N/A; 10/30/2022: ROBOTIC ASSISTED LAPAROSCOPIC OVARIAN CYSTECTOMY; Left     Comment:  Procedure: XI ROBOTIC ASSISTED LAPAROSCOPIC OVARIAN               CYSTECTOMY, DIAGNOSTIC LAPAROSCOPY;  Surgeon: Christeen Douglas, MD;  Location: [facility] ORS;  Service: Gynecology;                Laterality: Left; 10/30/2022: ROBOTIC ASSISTED SALPINGO OOPHERECTOMY; Left     Comment:  Procedure: XI ROBOTIC ASSISTED SALPINGO OOPHORECTOMY;                Surgeon: Christeen Douglas, MD;  Location: [facility] ORS;                Service: Gynecology;  Laterality: Left;  BMI    Body Mass Index: 17.72 kg/m      Reproductive/Obstetrics negative OB ROS                             Anesthesia Physical Anesthesia Plan  ASA: 2  Anesthesia Plan: General   Post-op Pain Management:    Induction: Intravenous  PONV Risk Score and Plan: Propofol infusion and TIVA  Airway Management Planned: Natural Airway  Additional Equipment:   Intra-op Plan:   Post-operative Plan:   Informed Consent: I have reviewed the patients History and Physical, chart, labs and discussed the  procedure including the risks, benefits and alternatives for the proposed anesthesia with the patient or authorized representative who has indicated his/her understanding and acceptance.       Plan Discussed with: CRNA and Surgeon  Anesthesia Plan Comments:         Anesthesia Quick Evaluation

## 2022-11-04 NOTE — Op Note (Signed)
[facility] Patient Name: Amy Salas Procedure Date: 11/04/2022 11:31 AM MRN: [id number] Account #: [id number] Date of Birth: 02/07/1991 Admit Type: Inpatient Age: 32 Room: [facility] ENDO ROOM 1 Gender: Female Note Status: Finalized Instrument Name: Upper Endoscope [phone] Procedure:             Upper GI endoscopy Indications:           Epigastric abdominal pain Providers:             [name] DO, DO Referring MD:          No Local Md, MD (Referring MD) Medicines:             Monitored Anesthesia Care Complications:         No immediate complications. Estimated blood loss:                         Minimal. Procedure:             Pre-Anesthesia Assessment:                        - Prior to the procedure, a History and Physical was                         performed, and patient medications and allergies were                         reviewed. The patient is competent. The risks and                         benefits of the procedure and the sedation options and                         risks were discussed with the patient. All questions                         were answered and informed consent was obtained.                         Patient identification and proposed procedure were                         verified by the physician, the nurse, the anesthetist                         and the technician in the endoscopy suite. Mental                         Status Examination: alert and oriented. Airway                         Examination: normal oropharyngeal airway and neck                         mobility. Respiratory Examination: clear to                         auscultation. CV Examination: RRR, no murmurs, no S3  or S4. Prophylactic Antibiotics: The patient does not                         require prophylactic antibiotics. Prior                         Anticoagulants: The patient has taken no anticoagulant                          or antiplatelet agents. ASA Grade Assessment: II - A                         patient with mild systemic disease. After reviewing                         the risks and benefits, the patient was deemed in                         satisfactory condition to undergo the procedure. The                         anesthesia plan was to use monitored anesthesia care                         (MAC). Immediately prior to administration of                         medications, the patient was re-assessed for adequacy                         to receive sedatives. The heart rate, respiratory                         rate, oxygen saturations, blood pressure, adequacy of                         pulmonary ventilation, and response to care were                         monitored throughout the procedure. The physical                         status of the patient was re-assessed after the                         procedure.                        After obtaining informed consent, the endoscope was                         passed under direct vision. Throughout the procedure,                         the patient's blood pressure, pulse, and oxygen                         saturations were monitored continuously. The Endoscope  was introduced through the mouth, and advanced to the                         third part of duodenum. The upper GI endoscopy was                         accomplished without difficulty. The patient tolerated                         the procedure well. Findings:      The duodenal bulb, first portion of the duodenum, second portion of the       duodenum and third portion of the duodenum were normal. Biopsies for       histology were taken with a cold forceps for evaluation of celiac       disease. Estimated blood loss was minimal.      Diffuse moderate inflammation characterized by erythema was found in the       entire examined stomach. Biopsies were taken with  a cold forceps for       Helicobacter pylori testing. Potentially healed/healing ulceration near       pylorus Estimated blood loss was minimal.      The Z-line was regular. Estimated blood loss: none.      Esophagogastric landmarks were identified: the gastroesophageal junction       was found at 40 cm from the incisors.      No endoscopic abnormality was evident in the esophagus to explain the       patient's complaint of dysphagia. Biopsies were obtained from the       proximal and distal esophagus with cold forceps for histology of       suspected eosinophilic esophagitis. Estimated blood loss was minimal.      LA Grade A (one or more mucosal breaks less than 5 mm, not extending       between tops of 2 mucosal folds) esophagitis with no bleeding was found.       Estimated blood loss: none.      The exam of the esophagus was otherwise normal. Impression:            - Normal duodenal bulb, first portion of the duodenum,                         second portion of the duodenum and third portion of                         the duodenum. Biopsied.                        - Gastritis. Biopsied.                        - Z-line regular.                        - Esophagogastric landmarks identified.                        - No endoscopic esophageal abnormality to explain                         patient's dysphagia.                        -  LA Grade A esophagitis with no bleeding.                        - Biopsies were taken with a cold forceps for                         evaluation of eosinophilic esophagitis. Recommendation:        - Patient has a contact number available for                         emergencies. The signs and symptoms of potential                         delayed complications were discussed with the patient.                         Return to normal activities tomorrow. Written                         discharge instructions were provided to the patient.                        -  Return patient to hospital ward for ongoing care.                        - Advance diet as tolerated.                        - Continue present medications.                        - Continue twice daily ppi                        Follow up outpatient for colonoscopy                        - No ibuprofen, naproxen, or other non-steroidal                         anti-inflammatory drugs.                        - Await pathology results.                        - Return to GI clinic at appointment to be scheduled.                        - The findings and recommendations were discussed with                         the patient.                        - The findings and recommendations were discussed with                         the referring physician.                        -  can provide mag citrate or lactulose to help with                         bowel movements.                        GI to sign off. Available as needed with call back. Procedure Code(s):     --- Professional ---                        [phone], Esophagogastroduodenoscopy, flexible,                         transoral; with biopsy, single or multiple Diagnosis Code(s):     --- Professional ---                        K29.70, Gastritis, unspecified, without bleeding                        R13.10, Dysphagia, unspecified                        K20.90, Esophagitis, unspecified without bleeding                        R10.13, Epigastric pain CPT copyright 2022 [employer]. All rights reserved. The codes documented in this report are preliminary and upon coder review may  be revised to meet current compliance requirements. Attending Participation:      I personally performed the entire procedure. Elfredia Nevins, DO [name] DO, DO 11/04/2022 1:45:44 PM This report has been signed electronically. Number of Addenda: 0 Note Initiated On: 11/04/2022 11:31 AM Estimated Blood Loss:  Estimated blood loss was  minimal.      [facility]

## 2022-11-04 NOTE — Plan of Care (Signed)

## 2022-11-04 NOTE — Progress Notes (Signed)
Subjective   Amy Salas is a 32 y.o. female G1P1001 who presented on 10/30/2022 for abdominal/pelvic pain localized to the left lower pelvis, found to have a ruptured hemorrhagic ovarian cyst and fallopian tube.    Hx of POTs, lap appy and lap chole. FHx of ovarian cancer in 22yo cousin. Fhx of colon cancer in 38 year old brother.   S/p 5 Days Post-Op Procedure(s): XI ROBOTIC ASSISTED LAPAROSCOPIC OVARIAN CYSTECTOMY, DIAGNOSTIC LAPAROSCOPY XI ROBOTIC ASSISTED SALPINGO OOPHORECTOMY EVACUATION OF HEMOPERITONEUM   Postop course has been slow and her pain is starting to improve today. No acute peritoneal sx.  She reports her pain related to the cyst has improved.  States that she feels sore around the the incision sites but it has improved. Earlier she reported severe RLQ pain that has now improved and is mild at this time.   She reports her vaginal bleeding has stopped today and denies any concerns.    CT scan 2 days ago was remarkable for moderate distension of the gastric lumen with enteric contrast material. No obvious hemoperitoneum, bowel injury, free air, or focal fluid collections to suggest an abscess.  Changes were compatible with recent surgery.    Urine output adequate, is tolerating regular diet in small amounts. Was NPO today for an EGD, now back to soft diet.   Enema 2 days ago successful, with another more formal bowel movement yesterday morning. Her biggest concern at this time is having a bowel movement because she does not believe her two bowel movements were adequate. She is requesting to remain inpatient at this time to use a suppository and continued Miralax to have another bowel movement prior to discharge.   S/p general surgery consult completed, concurring with above and encouraging bowel regimen. GI performed her EGD today and has signed off, they will follow-up with her outpatient for a colonoscopy.    Review of Systems: positives in bold GEN:    fevers, chills, weight changes, appetite changes, fatigue, night sweats HEENT:  HA, vision changes, hearing loss, congestion, rhinorrhea, sinus pressure, dysphagia CV:   CP, palpitations PULM:  SOB, cough GI:  abd pain, N/V/D/C GU:  dysuria, urgency, frequency MSK:  arthralgias, myalgias, back pain, swelling SKIN:  rashes, color changes, pallor NEURO:  numbness, weakness, tingling, seizures, dizziness, tremors PSYCH:  depression, anxiety, behavioral problems, confusion  HEME/LYMPH:  easy bruising or bleeding ENDO:  heat/cold intolerance  Past Obstetrical History: OB History     Gravida  1   Para  1   Term  1   Preterm      AB      Living  1      SAB      IAB      Ectopic      Multiple      Live Births  1           Past Gynecologic History: Patient's last menstrual period was 10/03/2022.   Past Medical History: Past Medical History:  Diagnosis Date   Ovarian cyst    POTS (postural orthostatic tachycardia syndrome)    PTSD (post-traumatic stress disorder)    Seizure (HCC)     Past Surgical History:   Past Surgical History:  Procedure Laterality Date   CHOLECYSTECTOMY     INGUINAL HERNIA REPAIR Bilateral    LAPAROSCOPIC APPENDECTOMY N/A 10/23/2020   Procedure: APPENDECTOMY LAPAROSCOPIC;  Surgeon: Leafy Ro, MD;  Location: [facility];  Service: General;  Laterality: N/A;   ROBOTIC ASSISTED LAPAROSCOPIC OVARIAN CYSTECTOMY  Left 10/30/2022   Procedure: XI ROBOTIC ASSISTED LAPAROSCOPIC OVARIAN CYSTECTOMY, DIAGNOSTIC LAPAROSCOPY;  Surgeon: Christeen Douglas, MD;  Location: [facility];  Service: Gynecology;  Laterality: Left;   ROBOTIC ASSISTED SALPINGO OOPHERECTOMY Left 10/30/2022   Procedure: XI ROBOTIC ASSISTED SALPINGO OOPHORECTOMY;  Surgeon: Christeen Douglas, MD;  Location: [facility];  Service: Gynecology;  Laterality: Left;    Family History:  family history is not on file.  Social History:  Social History   Socioeconomic History   Marital  status: Single    Spouse name: Not on file   Number of children: Not on file   Years of education: Not on file   Highest education level: Not on file  Occupational History   Not on file  Tobacco Use   Smoking status: Never   Smokeless tobacco: Never  Substance and Sexual Activity   Alcohol use: Not on file   Drug use: Not on file   Sexual activity: Not on file  Other Topics Concern   Not on file  Social History Narrative   Not on file   Social Determinants of Health   Financial Resource Strain: Not on file  Food Insecurity: No Food Insecurity (11/04/2022)   Hunger Vital Sign    Worried About Running Out of Food in the Last Year: Never true    Ran Out of Food in the Last Year: Never true  Transportation Needs: No Transportation Needs (11/04/2022)   PRAPARE - Administrator, Civil Service (Medical): No    Lack of Transportation (Non-Medical): No  Physical Activity: Not on file  Stress: Not on file  Social Connections: Not on file  Intimate Partner Violence: Not At Risk (11/04/2022)   Humiliation, Afraid, Rape, and Kick questionnaire    Fear of Current or Ex-Partner: No    Emotionally Abused: No    Physically Abused: No    Sexually Abused: No    Home Medications:  Medications reconciled in EPIC  No current facility-administered medications on file prior to encounter.   Current Outpatient Medications on File Prior to Encounter  Medication Sig Dispense Refill   cephALEXin (KEFLEX) 500 MG capsule Take 1 capsule (500 mg total) by mouth 3 (three) times daily. 30 capsule 0   ibuprofen (ADVIL) 600 MG tablet Take 1 tablet (600 mg total) by mouth every 6 (six) hours as needed. 30 tablet 0   ondansetron (ZOFRAN-ODT) 8 MG disintegrating tablet Take 1 tablet (8 mg total) by mouth every 8 (eight) hours as needed for nausea or vomiting. 20 tablet 0   oxyCODONE (OXY IR/ROXICODONE) 5 MG immediate release tablet Take 1 tablet (5 mg total) by mouth every 4 (four) hours as needed for  severe pain or breakthrough pain. 30 tablet 0   oxyCODONE-acetaminophen (PERCOCET) 5-325 MG tablet Take 1 tablet by mouth every 8 (eight) hours as needed for severe pain. 15 tablet 0   valACYclovir (VALTREX) 500 MG tablet Take 500 mg by mouth 2 (two) times daily as needed.      Allergies:  Allergies  Allergen Reactions   Bactrim [Sulfamethoxazole-Trimethoprim] Other (See Comments)    seizure   Sulfa Antibiotics     seizures    Physical Exam:  Temp:  [97.5 F (36.4 C)-98.2 F (36.8 C)] 98.2 F (36.8 C) (09/03 1648) Pulse Rate:  [53-61] 53 (09/03 1648) Resp:  [14-18] 18 (09/03 1648) BP: (92-103)/(52-74) 103/59 (09/03 1648) SpO2:  [95 %-100 %] 100 % (09/03 1648)   General Appearance:  Well developed, well nourished,  no acute distress, alert and oriented x3 HEENT:  Normocephalic atraumatic, extraocular movements intact, moist mucous membranes Cardiovascular:  Normal S1/S2, regular rate and rhythm, no murmurs Pulmonary:  clear to auscultation, no wheezes, rales or rhonchi, symmetric air entry, good air exchange Abdomen:  Bowel sounds present, soft, nontender, nondistended, no abnormal masses, no epigastric pain, incision sites covered with gauze at this time.  Extremities:  Full range of motion, no pedal edema, 2+ distal pulses, no tenderness Skin:  normal coloration and turgor, no rashes, no suspicious skin lesions noted  Neurologic:  Cranial nerves 2-12 grossly intact, normal muscle tone, strength 5/5 all four extremities Psychiatric:  Normal mood and affect, appropriate, no AH/VH   Labs/Studies:   CBC and Coags:  Lab Results  Component Value Date   WBC 4.4 11/04/2022   NEUTOPHILPCT 69 11/02/2022   EOSPCT 1 11/02/2022   BASOPCT 0 11/02/2022   LYMPHOPCT 25 11/02/2022   HGB 10.6 (L) 11/04/2022   HCT 31.5 (L) 11/04/2022   MCV 90.0 11/04/2022   PLT 210 11/04/2022   CMP:  Lab Results  Component Value Date   NA 138 11/04/2022   K 3.6 11/04/2022   CL 104 11/04/2022    CO2 26 11/04/2022   BUN 10 11/04/2022   CREATININE 0.70 11/04/2022   CREATININE 0.84 11/02/2022   CREATININE 0.61 10/31/2022   PROT 7.0 11/02/2022   BILITOT 0.6 11/02/2022   BILIDIR 0.1 10/23/2020   ALT 20 11/02/2022   AST 23 11/02/2022   ALKPHOS 48 11/02/2022    Other Imaging: CT ABDOMEN PELVIS W CONTRAST  Result Date: 11/01/2022 CLINICAL DATA:  One day postop. Status post robotic assisted laparoscopic salpingo-oophorectomy. Patient complains of abdominal pain. EXAM: CT ABDOMEN AND PELVIS WITH CONTRAST TECHNIQUE: Multidetector CT imaging of the abdomen and pelvis was performed using the standard protocol following bolus administration of intravenous contrast. RADIATION DOSE REDUCTION: This exam was performed according to the departmental dose-optimization program which includes automated exposure control, adjustment of the mA and/or kV according to patient size and/or use of iterative reconstruction technique. CONTRAST:  75mL OMNIPAQUE IOHEXOL 300 MG/ML  SOLN COMPARISON:  12/27/2020. FINDINGS: Lower chest: Mild platelike atelectasis noted within the lung bases. No pleural fluid or consolidative change. Hepatobiliary: No suspicious liver abnormality. The gallbladder is either decompressed or surgically absent. No bile duct dilatation. Pancreas: Unremarkable. No pancreatic ductal dilatation or surrounding inflammatory changes. Spleen: Normal in size without focal abnormality. Adrenals/Urinary Tract: Normal adrenal glands. No nephrolithiasis or signs of obstructive uropathy. Too small to characterize right kidney cysts measure up to 6 mm compatible with Bosniak class 2 lesions. No follow-up imaging recommended. No suspicious bladder abnormality. Small amount of gas noted within the non dependent bladder lumen which likely reflects recent instrumentation. Stomach/Bowel: Moderate distension the gastric lumen with enteric contrast material. The appendix is not visualized. Postsurgical change at the cecal  base is favored to represent prior appendectomy. No bowel wall thickening, inflammation or distension. Vascular/Lymphatic: No significant vascular findings are present. No enlarged abdominal or pelvic lymph nodes. Reproductive: The uterus appears normal. Postoperative changes within the pelvis compatible with salpingo-oophorectomy. Other: There is a small volume of free fluid noted within the dependent portion of the pelvis, image 61/2. No focal fluid collections identified to suggest abscess. New scratch there is pneumoperitoneum identified. This is within normal limits for postop day 1 status post laparoscopic surgery. The visualized osseous structures appear normal. Musculoskeletal: No acute or significant osseous findings. IMPRESSION: 1. Postoperative changes compatible with salpingo-oophorectomy from  the previous day. 2. Small volume of free fluid noted within the dependent portion of the pelvis. Compatible with recent postoperative change. 3. No signs of abscess or bowel obstruction. Electronically Signed   By: Signa Kell M.D.   On: 11/01/2022 10:06   US PELVIC COMPLETE W TRANSVAGINAL AND TORSION R/O  Result Date: 10/30/2022 CLINICAL DATA:  Abdominal pain for 2 weeks EXAM: TRANSABDOMINAL AND TRANSVAGINAL ULTRASOUND OF PELVIS DOPPLER ULTRASOUND OF OVARIES TECHNIQUE: Both transabdominal and transvaginal ultrasound examinations of the pelvis were performed. Transabdominal technique was performed for global imaging of the pelvis including uterus, ovaries, adnexal regions, and pelvic cul-de-sac. It was necessary to proceed with endovaginal exam following the transabdominal exam to visualize the adnexa and endometrium. Color and duplex Doppler ultrasound was utilized to evaluate blood flow to the ovaries. COMPARISON:  Ultrasound 10/25/2022 and older. FINDINGS: Uterus Measurements: 8.8 x 4.2 x 5.3 cm = volume: 102 mL. No fibroids or other mass visualized. Prominent uterine vessels. Endometrium Thickness: 10  mm.  No focal abnormality visualized. Right ovary Measurements: 4.0 x 2.3 x 2.4 cm = volume: 12 mL. Normal appearance/no adnexal mass. Left ovary Measurements: 5.1 x 3.4 x 3.6 cm = volume: 32 mL. There is complex hypoechoic area in the left ovary measuring 2.7 x 2.8 x 2.6 cm today previously similar size focus was seen measuring 2.5 x 2.2 x 2.4 cm. Few more internal echoes in the structure no discrete abnormal blood flow within this structure. Pulsed Doppler evaluation of both ovaries demonstrates normal low-resistance arterial and venous waveforms. Other findings Small amount of free fluid in the pelvis. IMPRESSION: 2.8 cm complex cystic structure in the left ovary. This could be an evolving hemorrhagic or functional cyst based on overall appearance. Of note on older exam from 10/23/2022 left ovarian lesion was measured at 4.3 cm. Preserved blood flow to the left ovary. Trace free fluid in the pelvis. With a persistent pain would recommend follow up imaging in 6-12 weeks to confirm resolution. Electronically Signed   By: Karen Kays M.D.   On: 10/30/2022 13:37   US PELVIC COMPLETE W TRANSVAGINAL AND TORSION R/O  Result Date: 10/25/2022 CLINICAL DATA:  Pelvic pain beginning this morning. EXAM: TRANSABDOMINAL AND TRANSVAGINAL ULTRASOUND OF PELVIS DOPPLER ULTRASOUND OF OVARIES TECHNIQUE: Both transabdominal and transvaginal ultrasound examinations of the pelvis were performed. Transabdominal technique was performed for global imaging of the pelvis including uterus, ovaries, adnexal regions, and pelvic cul-de-sac. It was necessary to proceed with endovaginal exam following the transabdominal exam to visualize the endometrium and adnexa. Color and duplex Doppler ultrasound was utilized to evaluate blood flow to the ovaries. COMPARISON:  Pelvic ultrasound 10/23/2022 FINDINGS: Uterus Measurements: 8.0 x 4.1 x 5.0 cm = volume: 86.5 mL. No fibroids or other mass visualized. Endometrium Thickness: 7 mm, within normal  limits. No focal abnormality visualized. Right ovary Measurements: 2.8 x 2.3 x 2.0 cm = volume: 7.0 mL. Normal appearance/no adnexal mass. Left ovary Measurements: 4.3 x 3.6 x 2.8 cm = volume: 22.4 mL. The previously seen echogenic structure has changed. A hypoechoic lesion is now present, measuring 2.5 x 2.2 x 2.4 cm. This likely represents a broad treated cyst. Pulsed Doppler evaluation of both ovaries demonstrates normal low-resistance arterial and venous waveforms. Other findings Trace free fluid is present. IMPRESSION: 1. The previously seen echogenic structure in the left ovary has changed. A hypoechoic lesion is now present, measuring 2.5 x 2.2 x 2.4 cm. This likely represents a hemorrhagic cyst. 2. Trace free fluid in  the pelvis is likely physiologic. 3. Normal appearance of the uterus and right ovary. Electronically Signed   By: Marin Roberts M.D.   On: 10/25/2022 10:47   US PELVIC COMPLETE W TRANSVAGINAL AND TORSION R/O  Result Date: 10/23/2022 CLINICAL DATA:  Sharp left lower quadrant pain EXAM: TRANSABDOMINAL AND TRANSVAGINAL ULTRASOUND OF PELVIS DOPPLER ULTRASOUND OF OVARIES TECHNIQUE: Both transabdominal and transvaginal ultrasound examinations of the pelvis were performed. Transabdominal technique was performed for global imaging of the pelvis including uterus, ovaries, adnexal regions, and pelvic cul-de-sac. It was necessary to proceed with endovaginal exam following the transabdominal exam to visualize the ovaries. Color and duplex Doppler ultrasound was utilized to evaluate blood flow to the ovaries. COMPARISON:  None Available. FINDINGS: Uterus Measurements: 9.8 cm in sagittal dimension. No fibroids or other mass visualized. Endometrium Thickness: 9.  No focal abnormality visualized. Right ovary Measurements: 3.4 x 2.8 x 1.9 cm = volume: 9.7 mL. Normal appearance. No adnexal mass. Left ovary Measurements: 5.1 x 4.8 x 3.9 cm = volume: 50.9 mL. Contains a heterogeneously echogenic  structure measuring 4.3 x 4.2 x 3.4 cm. No definite internal vascularity. Pulsed Doppler evaluation of both ovaries demonstrates normal low-resistance arterial and venous waveforms in the right ovary and within the peripheral ovarian stroma in the left ovary. Other findings No abnormal free fluid. IMPRESSION: 1. Heterogeneously echogenic structure in the left ovary measuring up to 4.3 cm without appreciable central vascularity, which may represent a hemorrhagic cyst or endometrioma. Consider repeat ultrasound examination in 6 weeks to ensure resolution. 2. No current finding of ovarian torsion. Recommend repeat ultrasound examination with Doppler if there is acute worsening of symptoms as the asymmetrically enlarged size of the left ovary may act as a lead point for torsion. Electronically Signed   By: Agustin Cree M.D.   On: 10/23/2022 10:39     Assessment / Plan:   [facility] Amy Salas is a 32 y.o., hospital day: 5  1. 5 days post-op, procedures: XI ROBOTIC ASSISTED LAPAROSCOPIC OVARIAN CYSTECTOMY, DIAGNOSTIC LAPAROSCOPY XI ROBOTIC ASSISTED SALPINGO OOPHORECTOMY EVACUATION OF HEMOPERITONEUM  2. Vaginal bleeding has resolved. Continue Provera BID until 09/09  3. Postop:  - s/p daily PT to assist with ambulation. SCDs ordered until fully ambulatory. With a short, minimally invasive surgery without malignancy, will hold starting LMWH at this time.  - Tylenol, Oxycodone, and Dilaudid PRN pain  4. UTI: - continue Keflex as ordered  5. Constipation: - Miralax PRN - Dulcolax suppository overnight - Soft diet - Follow-up outpatient with GI for colonoscopy - passing gas without any concerns  6. Fhx of ovarian cancer:  - Plan for BRCA and HNPCC testing as outpatient. Will keep already scheduled appointment in our office.   7. Clinically improving since admission. Discussed discharge home today but she is concerned about d/c home without fully emptying her bowels. Discussed with Dr.  Feliberto Gottron and she will remain inpatient overnight to use Miralax and Dulcolax and enema PRN and will plan to d/c home tomorrow for further outpatient management.    Janyce Llanos, CNM 11/04/2022 6:13 PM

## 2022-11-05 ENCOUNTER — Encounter: Payer: Self-pay | Admitting: Gastroenterology

## 2022-11-05 DIAGNOSIS — E611 Iron deficiency: Secondary | ICD-10-CM | POA: Insufficient documentation

## 2022-11-05 DIAGNOSIS — E538 Deficiency of other specified B group vitamins: Secondary | ICD-10-CM | POA: Diagnosis not present

## 2022-11-05 DIAGNOSIS — D62 Acute posthemorrhagic anemia: Secondary | ICD-10-CM | POA: Diagnosis not present

## 2022-11-05 DIAGNOSIS — R1013 Epigastric pain: Secondary | ICD-10-CM | POA: Diagnosis not present

## 2022-11-05 DIAGNOSIS — K2960 Other gastritis without bleeding: Secondary | ICD-10-CM

## 2022-11-05 DIAGNOSIS — N83209 Unspecified ovarian cyst, unspecified side: Secondary | ICD-10-CM | POA: Diagnosis not present

## 2022-11-05 DIAGNOSIS — D696 Thrombocytopenia, unspecified: Secondary | ICD-10-CM

## 2022-11-05 LAB — CBC
HCT: 33.1 % — ABNORMAL LOW (ref 36.0–46.0)
Hemoglobin: 11.2 g/dL — ABNORMAL LOW (ref 12.0–15.0)
MCH: 29.9 pg (ref 26.0–34.0)
MCHC: 33.8 g/dL (ref 30.0–36.0)
MCV: 88.3 fL (ref 80.0–100.0)
Platelets: 232 10*3/uL (ref 150–400)
RBC: 3.75 MIL/uL — ABNORMAL LOW (ref 3.87–5.11)
RDW: 14.1 % (ref 11.5–15.5)
WBC: 4.8 10*3/uL (ref 4.0–10.5)
nRBC: 0 % (ref 0.0–0.2)

## 2022-11-05 LAB — BASIC METABOLIC PANEL
Anion gap: 7 (ref 5–15)
BUN: 11 mg/dL (ref 6–20)
CO2: 23 mmol/L (ref 22–32)
Calcium: 8.6 mg/dL — ABNORMAL LOW (ref 8.9–10.3)
Chloride: 108 mmol/L (ref 98–111)
Creatinine, Ser: 0.76 mg/dL (ref 0.44–1.00)
GFR, Estimated: >60 mL/min (ref >60)
Glucose, Bld: 92 mg/dL (ref 70–99)
Potassium: 3.8 mmol/L (ref 3.5–5.1)
Sodium: 138 mmol/L (ref 135–145)

## 2022-11-05 MED ORDER — SIMETHICONE 80 MG PO CHEW: 80.0000 mg | CHEWABLE_TABLET | Freq: Four times a day (QID) | ORAL | 0 refills | Status: AC | PRN

## 2022-11-05 MED ORDER — POLYSACCHARIDE IRON COMPLEX 150 MG PO CAPS: 150.0000 mg | ORAL_CAPSULE | Freq: Every day | ORAL | 1 refills | Status: AC

## 2022-11-05 MED ORDER — CEPHALEXIN 500 MG PO CAPS: 500.0000 mg | ORAL_CAPSULE | Freq: Three times a day (TID) | ORAL | 0 refills | Status: AC

## 2022-11-05 MED ORDER — PANTOPRAZOLE SODIUM 40 MG PO TBEC: 40.0000 mg | DELAYED_RELEASE_TABLET | Freq: Two times a day (BID) | ORAL | 2 refills | Status: AC

## 2022-11-05 MED ORDER — ASCORBIC ACID 500 MG PO TABS: 500.0000 mg | ORAL_TABLET | Freq: Every day | ORAL | 1 refills | Status: AC

## 2022-11-05 MED ORDER — LIDOCAINE VISCOUS HCL 2 % MT SOLN: 15.0000 mL | Freq: Four times a day (QID) | OROMUCOSAL | 0 refills | Status: AC | PRN

## 2022-11-05 MED ORDER — ASCORBIC ACID 500 MG PO TABS: 500.0000 mg | ORAL_TABLET | Freq: Every day | ORAL | 1 refills | Status: DC

## 2022-11-05 MED ORDER — SIMETHICONE 80 MG PO CHEW: 80.0000 mg | CHEWABLE_TABLET | Freq: Four times a day (QID) | ORAL | 0 refills | Status: DC | PRN

## 2022-11-05 MED ORDER — PANTOPRAZOLE SODIUM 40 MG PO TBEC: 40.0000 mg | DELAYED_RELEASE_TABLET | Freq: Two times a day (BID) | ORAL | 2 refills | Status: DC

## 2022-11-05 MED ORDER — POLYETHYLENE GLYCOL 3350 17 G PO PACK: 17.0000 g | PACK | Freq: Two times a day (BID) | ORAL | 0 refills | Status: AC

## 2022-11-05 MED ORDER — LIDOCAINE VISCOUS HCL 2 % MT SOLN: 15.0000 mL | Freq: Four times a day (QID) | OROMUCOSAL | 0 refills | Status: DC | PRN

## 2022-11-05 MED ORDER — CEPHALEXIN 500 MG PO CAPS: 500.0000 mg | ORAL_CAPSULE | Freq: Three times a day (TID) | ORAL | 0 refills | Status: DC

## 2022-11-05 MED ORDER — POLYSACCHARIDE IRON COMPLEX 150 MG PO CAPS: 150.0000 mg | ORAL_CAPSULE | Freq: Every day | ORAL | 1 refills | Status: DC

## 2022-11-05 MED ORDER — LIDOCAINE VISCOUS HCL 2 % MT SOLN
15.0000 mL | Freq: Four times a day (QID) | OROMUCOSAL | Status: DC | PRN
  Filled 2022-11-05: qty 15

## 2022-11-05 MED ORDER — CYANOCOBALAMIN 1000 MCG PO TABS: 1000.0000 ug | ORAL_TABLET | Freq: Every day | ORAL | 1 refills | Status: DC

## 2022-11-05 MED ORDER — CYANOCOBALAMIN 1000 MCG PO TABS: 1000.0000 ug | ORAL_TABLET | Freq: Every day | ORAL | 1 refills | Status: AC

## 2022-11-05 MED ORDER — POLYETHYLENE GLYCOL 3350 17 G PO PACK: 17.0000 g | PACK | Freq: Two times a day (BID) | ORAL | 0 refills | Status: DC

## 2022-11-05 MED ORDER — FOLIC ACID 1 MG PO TABS: 1.0000 mg | ORAL_TABLET | Freq: Every day | ORAL | 1 refills | Status: DC

## 2022-11-05 MED ORDER — FOLIC ACID 1 MG PO TABS: 1.0000 mg | ORAL_TABLET | Freq: Every day | ORAL | 1 refills | Status: AC

## 2022-11-05 NOTE — Anesthesia Postprocedure Evaluation (Signed)
Anesthesia Post Note  Patient: Deserai Mcdowall Harcum  Procedure(s) Performed: ESOPHAGOGASTRODUODENOSCOPY (EGD) WITH PROPOFOL BIOPSY  Patient location during evaluation: PACU Anesthesia Type: General Level of consciousness: awake and alert Pain management: pain level controlled Vital Signs Assessment: post-procedure vital signs reviewed and stable Respiratory status: spontaneous breathing, nonlabored ventilation and respiratory function stable Cardiovascular status: blood pressure returned to baseline and stable Postop Assessment: no apparent nausea or vomiting Anesthetic complications: no   No notable events documented.   Last Vitals:  Vitals:   11/04/22 1648 11/04/22 2341  BP: (!) 103/59 99/61  Pulse: (!) 53 62  Resp: 18 15  Temp: 36.8 C 36.7 C  SpO2: 100% 100%    Last Pain:  Vitals:   11/05/22 0640  TempSrc:   PainSc: 7                  Foye Deer

## 2022-11-05 NOTE — Discharge Summary (Signed)
Physician Discharge Summary   Patient: Amy Salas MRN: [id number] DOB: 1990-05-21  Admit date:     10/30/2022  Discharge date: 11/05/22  Discharge Physician: Marcelino Duster   PCP: Pcp, No   Recommendations at discharge:  {Tip this will not be part of the note when signed- Example include specific recommendations for outpatient follow-up, pending tests to follow-up on. (Optional):26781}  PCP follow up in 1 week. Ob/gyn and GI follow up as scheduled.  Discharge Diagnoses: Principal Problem:   Hemorrhagic ovarian cyst Active Problems:   LLQ pain   Abdominal pain   Anemia   Thrombocytopenia (HCC)   Gastritis without bleeding   Acute blood loss anemia   Iron deficiency   Folic acid deficiency   B12 deficiency  Resolved Problems:   * No resolved hospital problems. *  Hospital Course: Amy Salas is a 32 y.o. female with past medical history of ovarian cyst, POTS syndrome, PTSD seizure disorder presenting to the hospital with recurrent lower abdominal pain with noted hemorrhagic ovarian cyst.  Was initially consulted yesterday with evaluation concerning for gastritis.  Patient started on IV PPI, trial of GI cocktail as well as bowel regimen per general surgery recommendations.  Per Dr. Dalbert Garnet, plan was to discharge patient in the morning with trial of PPI treatment as well as bowel regimen.  Received report about patient having severe lower abdominal pain with severe cramping and menstrual bleeding.  On evaluation, patient states that IV PPI gave some transient improvement though with some still present epigastric pain.  Positive nausea and vomiting.?  Brown versus black emesis per patient.  No black or bloody stools.  No chest pain or shortness of breath.  TRH was consulted for abdominal pain.  GI was consulted for GERD possible EGD.   Assessment and Plan:   # Ruptured hemorrhagic ovarian cyst and fallopian tube  S/p laparoscopic ovarian  cystectomy, salpingo-oophorectomy and evacuation of hemoperitoneum done by OB/GYN Continue as needed medication for pain control Continue Provera 10 mg p.o. twice daily Further management as per primary team Patient is still having vaginal bleeding   # GERD, continue PPI twice daily GI consulted, plan for EGD today. Follow GI for further recommendation.   # UTI, UA positive, urine culture growing E. coli, pansensitive. Started Keflex 500 mg p.o. 3 times daily for 5 days   # Acute blood loss anemia due to vaginal bleeding Hb 10.6 stable Monitor H&H and transfuse if hemoglobin less than 7   # Iron deficiency, started oral iron supplement with vitamin C # Folic acid deficiency, started folic acid oral supplement # Vitamin B12 level 284, goal >400, started vitamin B12 1000 mcg IM injection daily for 3 days followed by oral supplement.  Assessment and Plan:     {Tip this will not be part of the note when signed Body mass index is 17.72 kg/m. , ,  (Optional):26781}  {(NOTE) Pain control PDMP Statment (Optional):26782} Consultants: *** Procedures performed: ***  Disposition: {Plan; Disposition:26390} Diet recommendation:  Discharge Diet Orders (From admission, onward)     Start     Ordered   11/05/22 0000  Diet general       Comments: Bland soft diet.   11/05/22 1338           {Diet_Plan:26776} DISCHARGE MEDICATION: Allergies as of 11/05/2022       Reactions   Bactrim [sulfamethoxazole-trimethoprim] Other (See Comments)   seizure   Sulfa Antibiotics    seizures  Medication List     STOP taking these medications    ibuprofen 600 MG tablet Commonly known as: ADVIL   metroNIDAZOLE 500 MG tablet Commonly known as: Flagyl   oxyCODONE-acetaminophen 5-325 MG tablet Commonly known as: Percocet       TAKE these medications    ascorbic acid 500 MG tablet Commonly known as: VITAMIN C Take 1 tablet (500 mg total) by mouth daily. Start taking on:  November 06, 2022   cephALEXin 500 MG capsule Commonly known as: KEFLEX Take 1 capsule (500 mg total) by mouth every 8 (eight) hours for 3 days. What changed: when to take this   cyanocobalamin 1000 MCG tablet Take 1 tablet (1,000 mcg total) by mouth daily. Start taking on: November 06, 2022   folic acid 1 MG tablet Commonly known as: FOLVITE Take 1 tablet (1 mg total) by mouth daily. Start taking on: November 06, 2022   iron polysaccharides 150 MG capsule Commonly known as: NIFEREX Take 1 capsule (150 mg total) by mouth daily. Start taking on: November 06, 2022   lidocaine 2 % solution Commonly known as: XYLOCAINE Use as directed 15 mLs in the mouth or throat every 6 (six) hours as needed (abdominal pain).   ondansetron 8 MG disintegrating tablet Commonly known as: ZOFRAN-ODT Take 1 tablet (8 mg total) by mouth every 8 (eight) hours as needed for nausea or vomiting.   oxyCODONE 5 MG immediate release tablet Commonly known as: Oxy IR/ROXICODONE Take 1 tablet (5 mg total) by mouth every 4 (four) hours as needed for severe pain or breakthrough pain.   pantoprazole 40 MG tablet Commonly known as: PROTONIX Take 1 tablet (40 mg total) by mouth 2 (two) times daily.   polyethylene glycol 17 g packet Commonly known as: MIRALAX / GLYCOLAX Take 17 g by mouth 2 (two) times daily.   simethicone 80 MG chewable tablet Commonly known as: MYLICON Chew 1 tablet (80 mg total) by mouth 4 (four) times daily as needed for flatulence.   valACYclovir 500 MG tablet Commonly known as: VALTREX Take 500 mg by mouth 2 (two) times daily as needed.        Follow-up Information     Christeen Douglas, MD. Schedule an appointment as soon as possible for a visit on 11/17/2022.   Specialty: Obstetrics and Gynecology Why: post-op incision follow-up appointment Sep.16th @ 10:15am Contact information: [address] [place] [phone]                Discharge  Exam: Filed Weights   10/30/22 1449 10/30/22 1607  Weight: 55.3 kg 54.4 kg   ***  Condition at discharge: {DC Condition:26389}  The results of significant diagnostics from this hospitalization (including imaging, microbiology, ancillary and laboratory) are listed below for reference.   Imaging Studies: CT ABDOMEN PELVIS W CONTRAST  Result Date: 11/01/2022 CLINICAL DATA:  One day postop. Status post robotic assisted laparoscopic salpingo-oophorectomy. Patient complains of abdominal pain. EXAM: CT ABDOMEN AND PELVIS WITH CONTRAST TECHNIQUE: Multidetector CT imaging of the abdomen and pelvis was performed using the standard protocol following bolus administration of intravenous contrast. RADIATION DOSE REDUCTION: This exam was performed according to the departmental dose-optimization program which includes automated exposure control, adjustment of the mA and/or kV according to patient size and/or use of iterative reconstruction technique. CONTRAST:  75mL OMNIPAQUE IOHEXOL 300 MG/ML  SOLN COMPARISON:  12/27/2020. FINDINGS: Lower chest: Mild platelike atelectasis noted within the lung bases. No pleural fluid or consolidative change. Hepatobiliary:  No suspicious liver abnormality. The gallbladder is either decompressed or surgically absent. No bile duct dilatation. Pancreas: Unremarkable. No pancreatic ductal dilatation or surrounding inflammatory changes. Spleen: Normal in size without focal abnormality. Adrenals/Urinary Tract: Normal adrenal glands. No nephrolithiasis or signs of obstructive uropathy. Too small to characterize right kidney cysts measure up to 6 mm compatible with Bosniak class 2 lesions. No follow-up imaging recommended. No suspicious bladder abnormality. Small amount of gas noted within the non dependent bladder lumen which likely reflects recent instrumentation. Stomach/Bowel: Moderate distension the gastric lumen with enteric contrast material. The appendix is not visualized.  Postsurgical change at the cecal base is favored to represent prior appendectomy. No bowel wall thickening, inflammation or distension. Vascular/Lymphatic: No significant vascular findings are present. No enlarged abdominal or pelvic lymph nodes. Reproductive: The uterus appears normal. Postoperative changes within the pelvis compatible with salpingo-oophorectomy. Other: There is a small volume of free fluid noted within the dependent portion of the pelvis, image 61/2. No focal fluid collections identified to suggest abscess. New scratch there is pneumoperitoneum identified. This is within normal limits for postop day 1 status post laparoscopic surgery. The visualized osseous structures appear normal. Musculoskeletal: No acute or significant osseous findings. IMPRESSION: 1. Postoperative changes compatible with salpingo-oophorectomy from the previous day. 2. Small volume of free fluid noted within the dependent portion of the pelvis. Compatible with recent postoperative change. 3. No signs of abscess or bowel obstruction. Electronically Signed   By: Signa Kell M.D.   On: 11/01/2022 10:06   US PELVIC COMPLETE W TRANSVAGINAL AND TORSION R/O  Result Date: 10/30/2022 CLINICAL DATA:  Abdominal pain for 2 weeks EXAM: TRANSABDOMINAL AND TRANSVAGINAL ULTRASOUND OF PELVIS DOPPLER ULTRASOUND OF OVARIES TECHNIQUE: Both transabdominal and transvaginal ultrasound examinations of the pelvis were performed. Transabdominal technique was performed for global imaging of the pelvis including uterus, ovaries, adnexal regions, and pelvic cul-de-sac. It was necessary to proceed with endovaginal exam following the transabdominal exam to visualize the adnexa and endometrium. Color and duplex Doppler ultrasound was utilized to evaluate blood flow to the ovaries. COMPARISON:  Ultrasound 10/25/2022 and older. FINDINGS: Uterus Measurements: 8.8 x 4.2 x 5.3 cm = volume: 102 mL. No fibroids or other mass visualized. Prominent uterine  vessels. Endometrium Thickness: 10 mm.  No focal abnormality visualized. Right ovary Measurements: 4.0 x 2.3 x 2.4 cm = volume: 12 mL. Normal appearance/no adnexal mass. Left ovary Measurements: 5.1 x 3.4 x 3.6 cm = volume: 32 mL. There is complex hypoechoic area in the left ovary measuring 2.7 x 2.8 x 2.6 cm today previously similar size focus was seen measuring 2.5 x 2.2 x 2.4 cm. Few more internal echoes in the structure no discrete abnormal blood flow within this structure. Pulsed Doppler evaluation of both ovaries demonstrates normal low-resistance arterial and venous waveforms. Other findings Small amount of free fluid in the pelvis. IMPRESSION: 2.8 cm complex cystic structure in the left ovary. This could be an evolving hemorrhagic or functional cyst based on overall appearance. Of note on older exam from 10/23/2022 left ovarian lesion was measured at 4.3 cm. Preserved blood flow to the left ovary. Trace free fluid in the pelvis. With a persistent pain would recommend follow up imaging in 6-12 weeks to confirm resolution. Electronically Signed   By: Karen Kays M.D.   On: 10/30/2022 13:37   US PELVIC COMPLETE W TRANSVAGINAL AND TORSION R/O  Result Date: 10/25/2022 CLINICAL DATA:  Pelvic pain beginning this morning. EXAM: TRANSABDOMINAL AND TRANSVAGINAL ULTRASOUND OF PELVIS  DOPPLER ULTRASOUND OF OVARIES TECHNIQUE: Both transabdominal and transvaginal ultrasound examinations of the pelvis were performed. Transabdominal technique was performed for global imaging of the pelvis including uterus, ovaries, adnexal regions, and pelvic cul-de-sac. It was necessary to proceed with endovaginal exam following the transabdominal exam to visualize the endometrium and adnexa. Color and duplex Doppler ultrasound was utilized to evaluate blood flow to the ovaries. COMPARISON:  Pelvic ultrasound 10/23/2022 FINDINGS: Uterus Measurements: 8.0 x 4.1 x 5.0 cm = volume: 86.5 mL. No fibroids or other mass visualized.  Endometrium Thickness: 7 mm, within normal limits. No focal abnormality visualized. Right ovary Measurements: 2.8 x 2.3 x 2.0 cm = volume: 7.0 mL. Normal appearance/no adnexal mass. Left ovary Measurements: 4.3 x 3.6 x 2.8 cm = volume: 22.4 mL. The previously seen echogenic structure has changed. A hypoechoic lesion is now present, measuring 2.5 x 2.2 x 2.4 cm. This likely represents a broad treated cyst. Pulsed Doppler evaluation of both ovaries demonstrates normal low-resistance arterial and venous waveforms. Other findings Trace free fluid is present. IMPRESSION: 1. The previously seen echogenic structure in the left ovary has changed. A hypoechoic lesion is now present, measuring 2.5 x 2.2 x 2.4 cm. This likely represents a hemorrhagic cyst. 2. Trace free fluid in the pelvis is likely physiologic. 3. Normal appearance of the uterus and right ovary. Electronically Signed   By: Marin Roberts M.D.   On: 10/25/2022 10:47   US PELVIC COMPLETE W TRANSVAGINAL AND TORSION R/O  Result Date: 10/23/2022 CLINICAL DATA:  Sharp left lower quadrant pain EXAM: TRANSABDOMINAL AND TRANSVAGINAL ULTRASOUND OF PELVIS DOPPLER ULTRASOUND OF OVARIES TECHNIQUE: Both transabdominal and transvaginal ultrasound examinations of the pelvis were performed. Transabdominal technique was performed for global imaging of the pelvis including uterus, ovaries, adnexal regions, and pelvic cul-de-sac. It was necessary to proceed with endovaginal exam following the transabdominal exam to visualize the ovaries. Color and duplex Doppler ultrasound was utilized to evaluate blood flow to the ovaries. COMPARISON:  None Available. FINDINGS: Uterus Measurements: 9.8 cm in sagittal dimension. No fibroids or other mass visualized. Endometrium Thickness: 9.  No focal abnormality visualized. Right ovary Measurements: 3.4 x 2.8 x 1.9 cm = volume: 9.7 mL. Normal appearance. No adnexal mass. Left ovary Measurements: 5.1 x 4.8 x 3.9 cm = volume: 50.9 mL.  Contains a heterogeneously echogenic structure measuring 4.3 x 4.2 x 3.4 cm. No definite internal vascularity. Pulsed Doppler evaluation of both ovaries demonstrates normal low-resistance arterial and venous waveforms in the right ovary and within the peripheral ovarian stroma in the left ovary. Other findings No abnormal free fluid. IMPRESSION: 1. Heterogeneously echogenic structure in the left ovary measuring up to 4.3 cm without appreciable central vascularity, which may represent a hemorrhagic cyst or endometrioma. Consider repeat ultrasound examination in 6 weeks to ensure resolution. 2. No current finding of ovarian torsion. Recommend repeat ultrasound examination with Doppler if there is acute worsening of symptoms as the asymmetrically enlarged size of the left ovary may act as a lead point for torsion. Electronically Signed   By: Agustin Cree M.D.   On: 10/23/2022 10:39    Microbiology: Results for orders placed or performed during the hospital encounter of 10/30/22  Urine Culture     Status: Abnormal   Collection Time: 11/01/22 10:16 AM   Specimen: Urine, Clean Catch  Result Value Ref Range Status   Specimen Description   Final    URINE, CLEAN CATCH Performed at [facility], [address] [place], [place] [address]  Special Requests   Final    NONE Performed at [facility], [address] [place], [place] [address]    Culture >=100,000 COLONIES/mL ESCHERICHIA COLI (A)  Final   Report Status 11/03/2022 FINAL  Final   Organism ID, Bacteria ESCHERICHIA COLI (A)  Final      Susceptibility   Escherichia coli - MIC*    AMPICILLIN >=32 RESISTANT Resistant     CEFAZOLIN <=4 SENSITIVE Sensitive     CEFEPIME <=0.12 SENSITIVE Sensitive     CEFTRIAXONE <=0.25 SENSITIVE Sensitive     CIPROFLOXACIN <=0.25 SENSITIVE Sensitive     GENTAMICIN <=1 SENSITIVE Sensitive     IMIPENEM <=0.25 SENSITIVE Sensitive     NITROFURANTOIN 64 INTERMEDIATE Intermediate     TRIMETH/SULFA  <=20 SENSITIVE Sensitive     AMPICILLIN/SULBACTAM 16 INTERMEDIATE Intermediate     PIP/TAZO <=4 SENSITIVE Sensitive     * >=100,000 COLONIES/mL ESCHERICHIA COLI    Labs: CBC: Recent Labs  Lab 10/31/22 0053 11/02/22 1642 11/03/22 0836 11/03/22 1551 11/04/22 0444 11/05/22 0432  WBC 7.5 8.6 4.0  --  4.4 4.8  NEUTROABS  --  5.8  --   --   --   --   HGB 9.3* 11.6* 9.8* 10.6* 10.6* 11.2*  HCT 27.3* 36.6 29.6* 32.9* 31.5* 33.1*  MCV 89.8 93.4 91.6  --  90.0 88.3  PLT 146* 238 184  --  210 232   Basic Metabolic Panel: Recent Labs  Lab 10/30/22 2200 10/31/22 0053 11/02/22 1642 11/03/22 0836 11/04/22 0444 11/05/22 0432  NA 135 136 140  --  138 138  K 4.0 3.6 4.0  --  3.6 3.8  CL 105 106 102  --  104 108  CO2 19* 22 30  --  26 23  GLUCOSE 99 136* 97  --  95 92  BUN 8 8 9   --  10 11  CREATININE 0.63 0.61 0.84  --  0.70 0.76  CALCIUM 8.7* 8.0* 8.9  --  8.5* 8.6*  MG  --   --   --  2.1 2.3  --   PHOS  --   --   --  4.1 4.4  --    Liver Function Tests: Recent Labs  Lab 10/30/22 1055 11/02/22 1642  AST 15 23  ALT 14 20  ALKPHOS 40 48  BILITOT 0.7 0.6  PROT 6.8 7.0  ALBUMIN 4.2 4.2   CBG: No results for input(s): "GLUCAP" in the last 168 hours.  Discharge time spent: 40 minutes.  Signed: Marcelino Duster, MD [facility] 11/05/2022

## 2022-11-05 NOTE — Progress Notes (Signed)
Physical Therapy Treatment Patient Details Name: Amy Salas MRN: [id number] DOB: 09-Jan-1991 Today's Date: 11/05/2022   History of Present Illness Pt admitted to [facility] on 10/30/22 under observation for c/o abdominal pain for several days, without relief from medication. S/p diagnostic laparoscopic, left ovarian cystectomy, and left salpingo-oophorectomy for hemorrhagic left ovarian cyst and hemoperitoneumon on 8/29. Significant PMH includes: seizure, POTS, and PTSD.    PT Comments  Patient received in bed, father at bedside. He and patient report she has not walked at all. Father stating "No one has come to walk her, she has not even had bed changed." (PT evaluation was performed over weekend). Patient is independent with bed mobility, donning shoes in bed. Transfers with supervision and ambulated 500 without ad and no lob, reports incisional pain. She is capable of ambulating independently, however demonstrates decreased motivation and labile during session. She will continue to benefit from daily ambulation while here.         If plan is discharge home, recommend the following: Help with stairs or ramp for entrance   Can travel by private vehicle      yes  Equipment Recommendations  None recommended by PT    Recommendations for Other Services       Precautions / Restrictions Precautions Precautions: None Restrictions Weight Bearing Restrictions: No     Mobility  Bed Mobility Overal bed mobility: Independent                  Transfers Overall transfer level: Independent   Transfers: Sit to/from Stand                  Ambulation/Gait Ambulation/Gait assistance: Independent, Supervision Gait Distance (Feet): 500 Feet Assistive device: None Gait Pattern/deviations: Step-through pattern Gait velocity: decr     General Gait Details: patient is supervision only for ambulation. No lob. No AD. She has decreased motivation, therefore needs  encouragement to get up and move.   Stairs             Wheelchair Mobility     Tilt Bed    Modified Rankin (Stroke Patients Only)       Balance Overall balance assessment: Independent Sitting-balance support: Feet supported Sitting balance-Leahy Scale: Normal     Standing balance support: No upper extremity supported, During functional activity Standing balance-Leahy Scale: Normal                              Cognition Arousal: Alert Behavior During Therapy: Flat affect, Lability Overall Cognitive Status: Within Functional Limits for tasks assessed                                 General Comments: patient appears generally depressed. Tearful during session.        Exercises      General Comments        Pertinent Vitals/Pain Pain Assessment Pain Assessment: Faces Faces Pain Scale: Hurts little more Pain Location: incisional Pain Descriptors / Indicators: Dull, Aching, Sore Pain Intervention(s): Monitored during session, Repositioned    Home Living                          Prior Function            PT Goals (current goals can now be found in the care plan section) Acute Rehab PT  Goals Patient Stated Goal: "go home" PT Goal Formulation: With patient Time For Goal Achievement: 11/15/22 Potential to Achieve Goals: Good Progress towards PT goals: Progressing toward goals    Frequency    Min 1X/week      PT Plan      Co-evaluation              AM-PAC PT "6 Clicks" Mobility   Outcome Measure  Help needed turning from your back to your side while in a flat bed without using bedrails?: None Help needed moving from lying on your back to sitting on the side of a flat bed without using bedrails?: None Help needed moving to and from a bed to a chair (including a wheelchair)?: None Help needed standing up from a chair using your arms (e.g., wheelchair or bedside chair)?: None Help needed to walk in  hospital room?: None Help needed climbing 3-5 steps with a railing? : None 6 Click Score: 24    End of Session   Activity Tolerance: Patient tolerated treatment well Patient left: in chair;with call bell/phone within reach;with family/visitor present Nurse Communication: Mobility status PT Visit Diagnosis: Muscle weakness (generalized) (M62.81)     Time: 1610-9604 PT Time Calculation (min) (ACUTE ONLY): 24 min  Charges:    $Gait Training: 23-37 mins PT General Charges $$ ACUTE PT VISIT: 1 Visit                     Satsuki Zillmer, PT, GCS 11/05/22,11:11 AM

## 2023-06-15 IMAGING — CT CT ABD-PELV W/ CM
2 of 4 series · 16 of 46 positions shown, 18 images · IV contrast (omnipaque)
Comparison: None.

CLINICAL DATA: Acute left flank pain.

EXAM:
CT ABDOMEN AND PELVIS WITH CONTRAST
TECHNIQUE: Multidetector CT imaging of the abdomen and pelvis was performed
using the standard protocol following bolus administration of
intravenous contrast.
CONTRAST:  80mL OMNIPAQUE IOHEXOL 350 MG/ML SOLN

[Series 2: routine abd/pel with · axial · 0.78mm/px · z∈[-927,-517]mm · 13 of 90 slices shown, 15 images]
[im 4/90  soft-tissue]
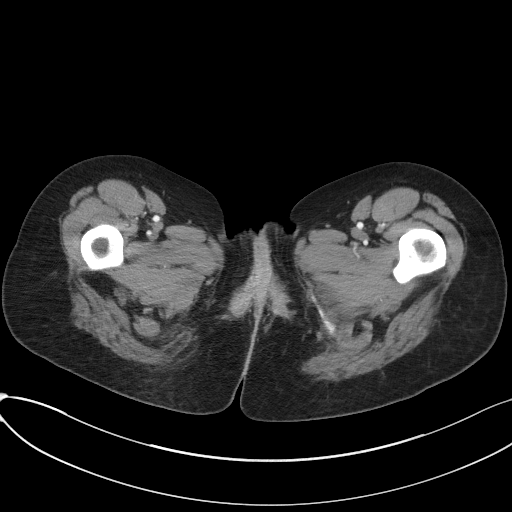
[im 4/90  bone]
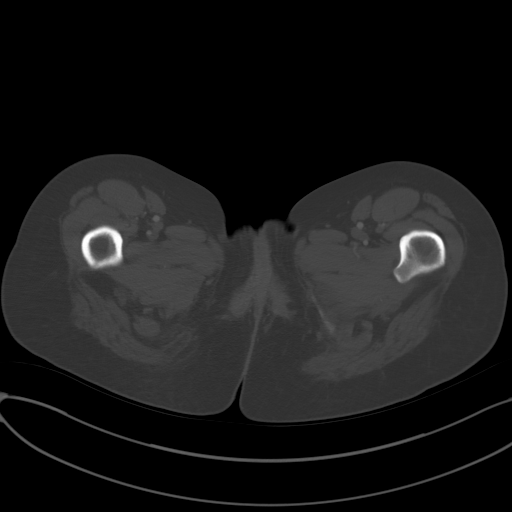
[im 12/90  soft-tissue]
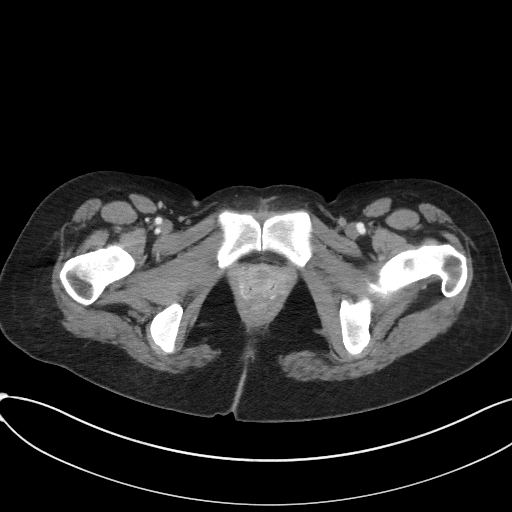
[im 19/90  soft-tissue]
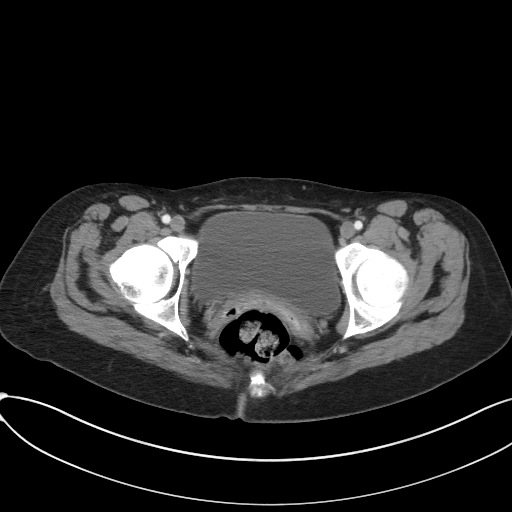
[im 26/90  soft-tissue]
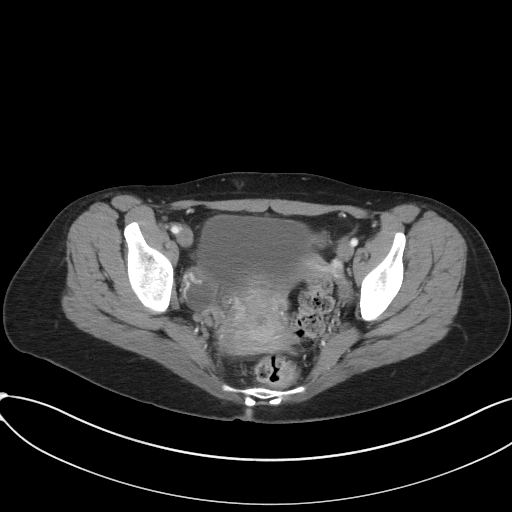
[im 30/90  soft-tissue]
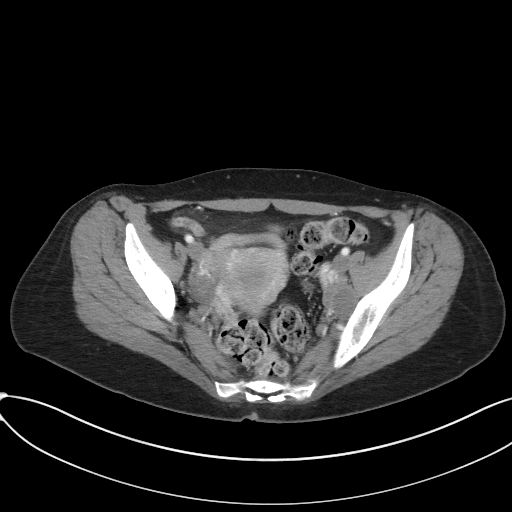
[im 38/90  soft-tissue]
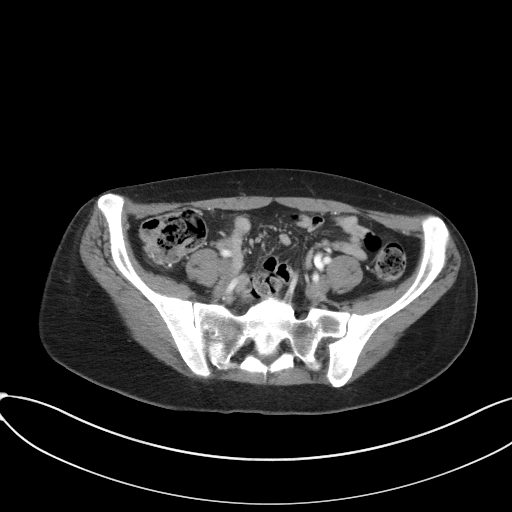
[im 45/90  soft-tissue]
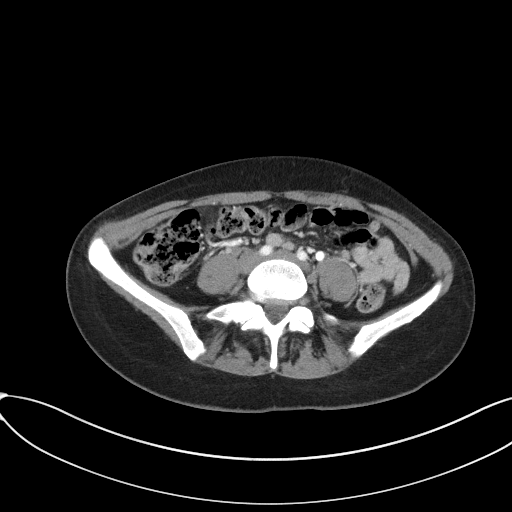
[im 52/90  soft-tissue]
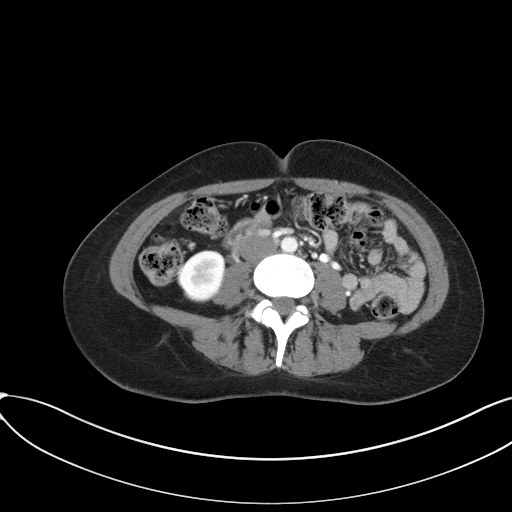
[im 60/90  soft-tissue]
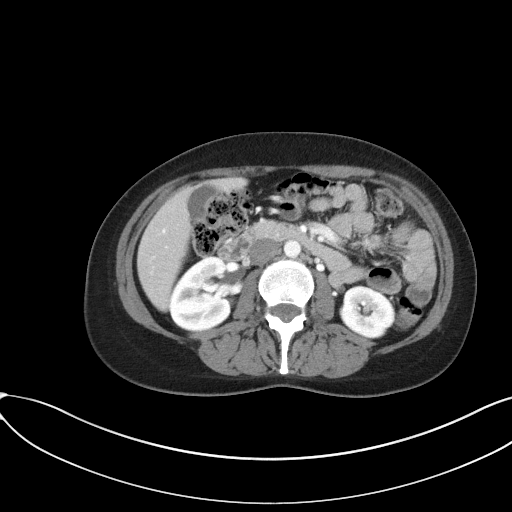
[im 60/90  bone]
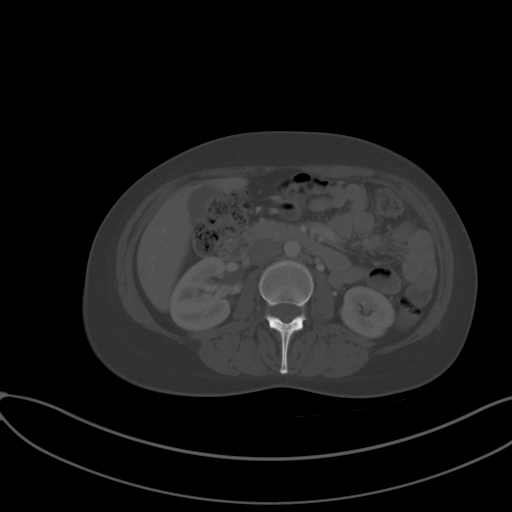
[im 64/90  soft-tissue]
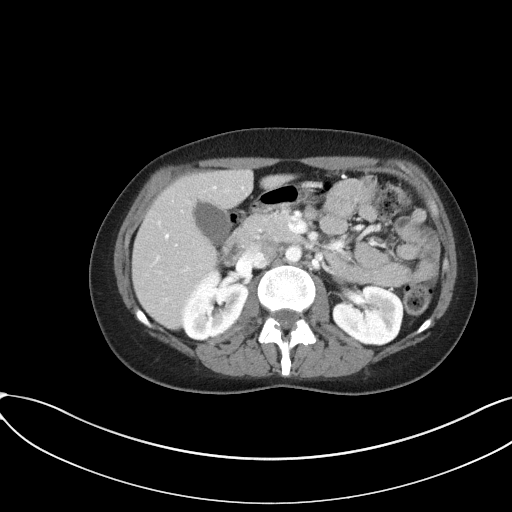
[im 71/90  soft-tissue]
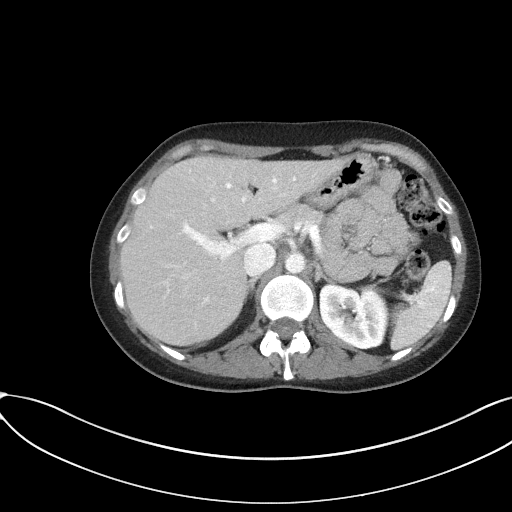
[im 78/90  soft-tissue]
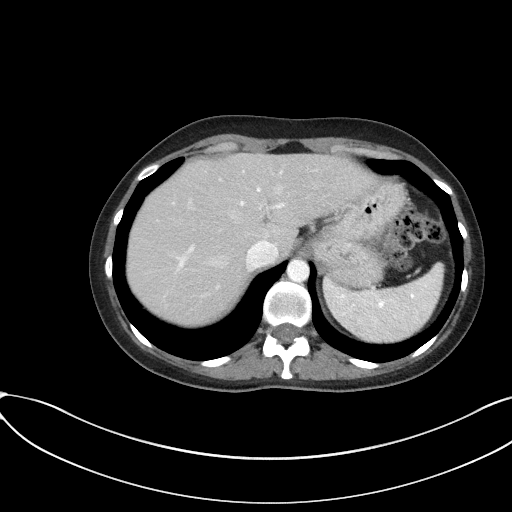
[im 86/90  soft-tissue]
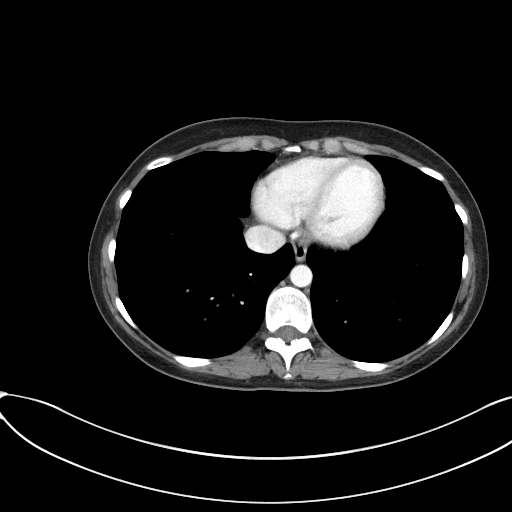

[Series 5: coronal st · coronal · 0.81mm/px · 3 of 82 slices shown]
[im 28/82  soft-tissue]
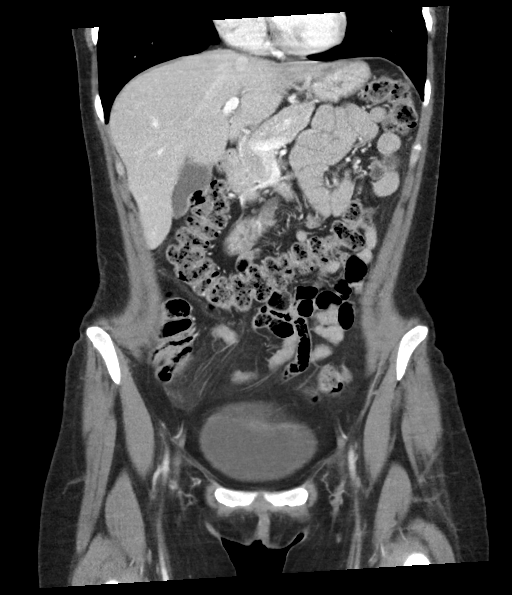
[im 37/82  soft-tissue]
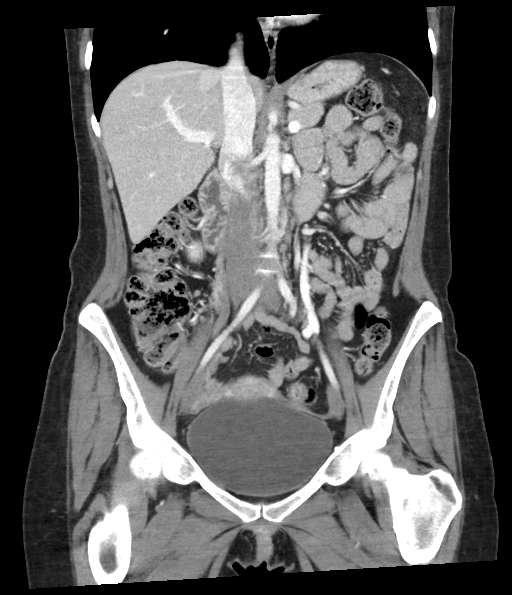
[im 46/82  soft-tissue]
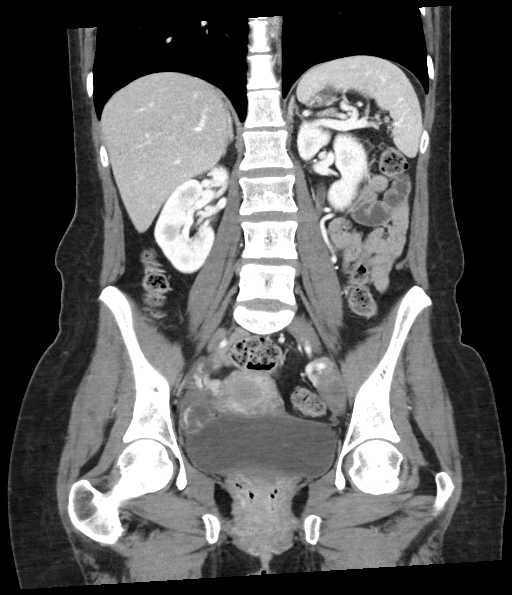

[16 of 46 positions shown; findings below may reference images not displayed]

FINDINGS: Lower chest: No acute abnormality.

Hepatobiliary: No focal liver abnormality is seen. No gallstones,
gallbladder wall thickening, or biliary dilatation.

Pancreas: Unremarkable. No pancreatic ductal dilatation or
surrounding inflammatory changes.

Spleen: Normal in size without focal abnormality.

Adrenals/Urinary Tract: Adrenal glands are unremarkable. Kidneys are
normal, without renal calculi, focal lesion, or hydronephrosis.
Bladder is unremarkable.

Stomach/Bowel: The stomach appears normal. There is no evidence of
bowel obstruction. The appendix is mildly dilated with mild
surrounding inflammation concerning for acute appendicitis.

Appendix: Location: Right lower quadrant.

Diameter: 9 mm.

Appendicolith: No.

Mucosal hyper-enhancement: Yes.

Extraluminal gas: No.

Periappendiceal collection: No.

Vascular/Lymphatic: No significant vascular findings are present. No
enlarged abdominal or pelvic lymph nodes.

Reproductive: Uterus and bilateral adnexa are unremarkable.

Other: No abdominal wall hernia or abnormality. No abdominopelvic
ascites.

Musculoskeletal: No acute or significant osseous findings.
IMPRESSION: Findings concerning for acute appendicitis. No definite abscess is
noted.

## 2023-08-19 IMAGING — CT CT ABD-PELV W/ CM
2 of 5 series · 16 of 46 positions shown, 18 images · IV contrast (omnipaque)
Comparison: CT abdomen pelvis dated 10/23/2020.

CLINICAL DATA: Abdominal pain, nausea, and vomiting. The patient
had a laparoscopic appendectomy 1 month ago and now has pain and
drainage from the right sided mid abdominal incision.

EXAM:
CT ABDOMEN AND PELVIS WITH CONTRAST
TECHNIQUE: Multidetector CT imaging of the abdomen and pelvis was performed
using the standard protocol following bolus administration of
intravenous contrast.
CONTRAST:  80mL OMNIPAQUE IOHEXOL 300 MG/ML  SOLN

[Series 2: axial st · axial · 0.68mm/px · z∈[-916,-536]mm · 13 of 90 slices shown, 15 images]
[im 7/90  soft-tissue]
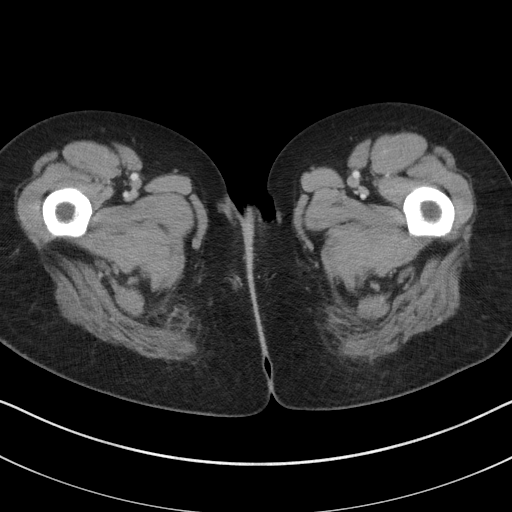
[im 7/90  bone]
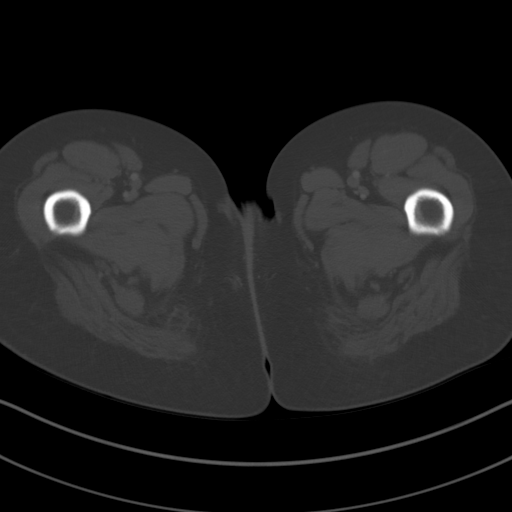
[im 13/90  soft-tissue]
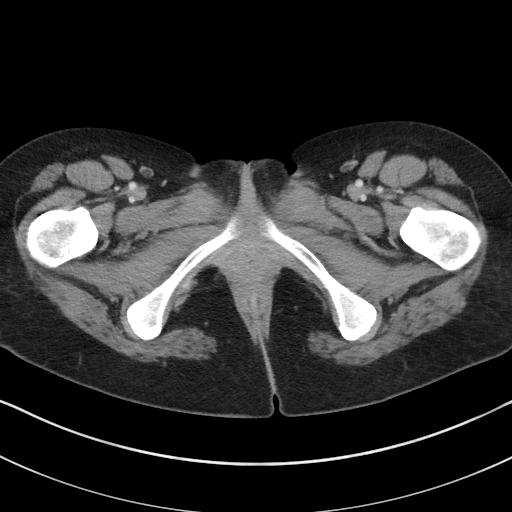
[im 20/90  soft-tissue]
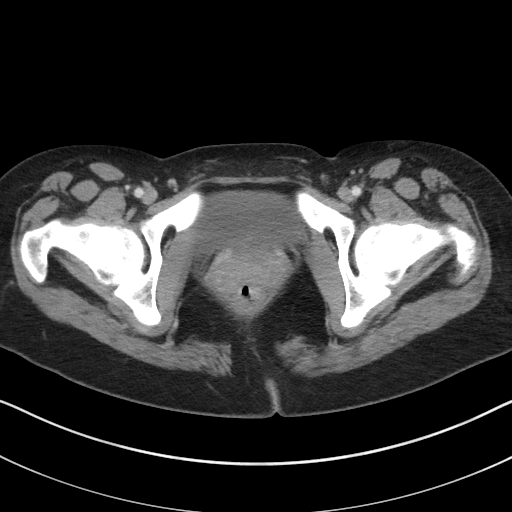
[im 26/90  soft-tissue]
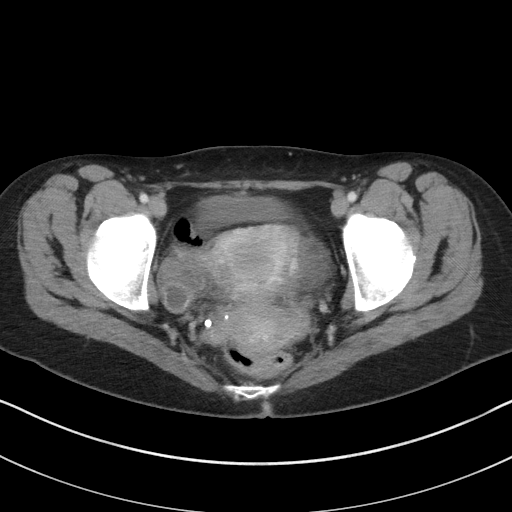
[im 32/90  soft-tissue]
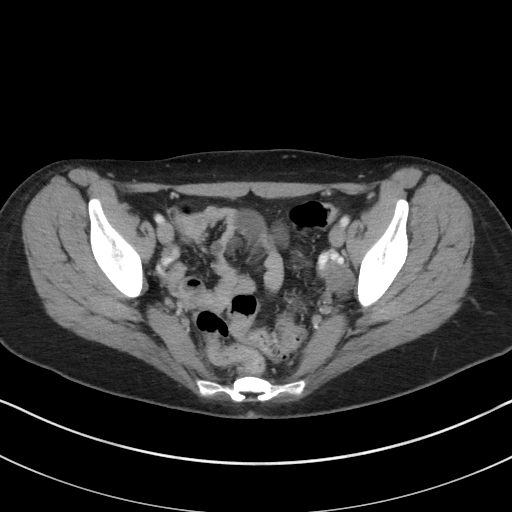
[im 39/90  soft-tissue]
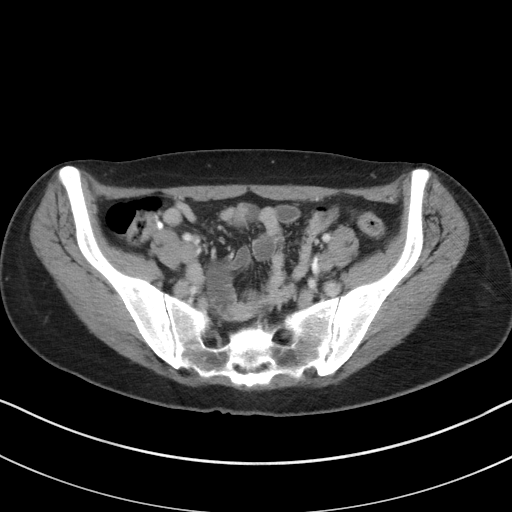
[im 45/90  soft-tissue]
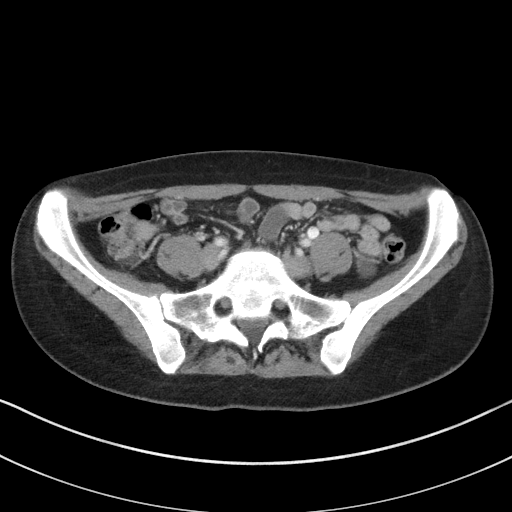
[im 51/90  soft-tissue]
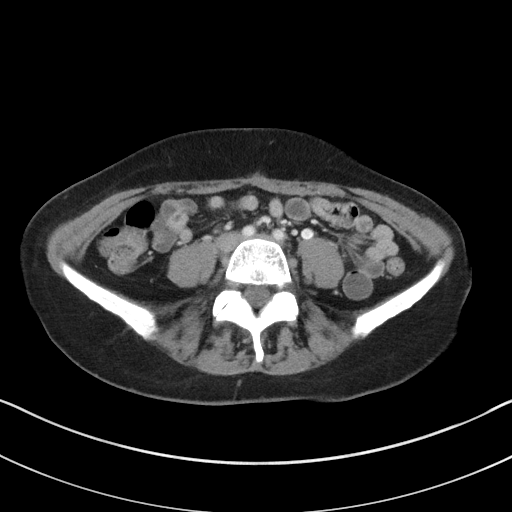
[im 58/90  soft-tissue]
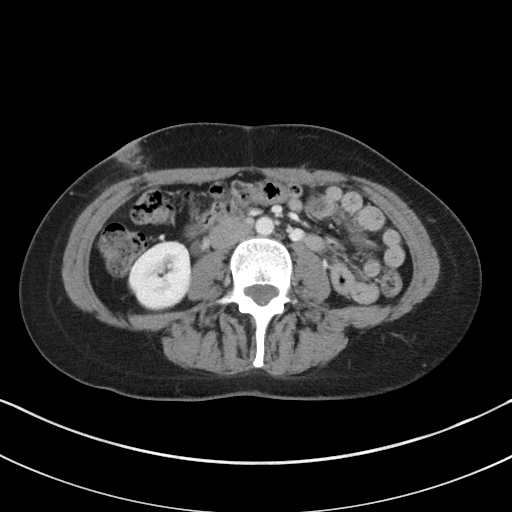
[im 58/90  bone]
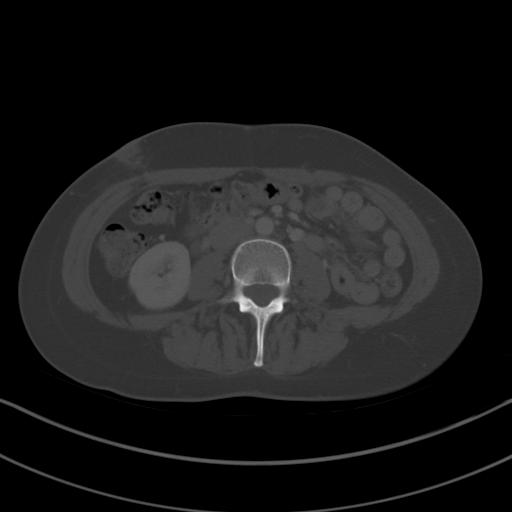
[im 64/90  soft-tissue]
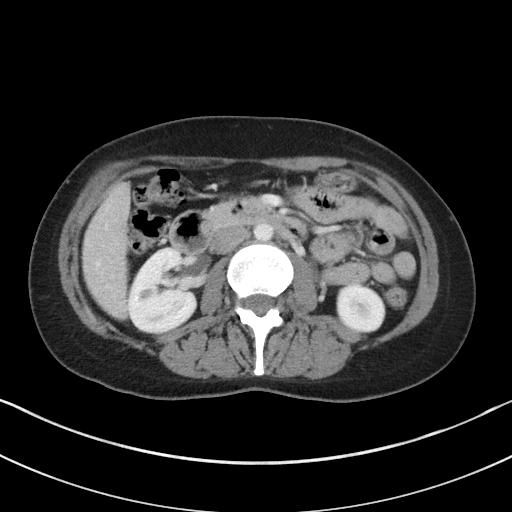
[im 70/90  soft-tissue]
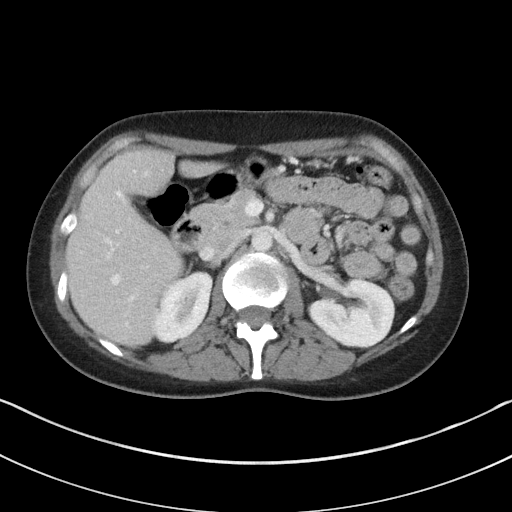
[im 77/90  soft-tissue]
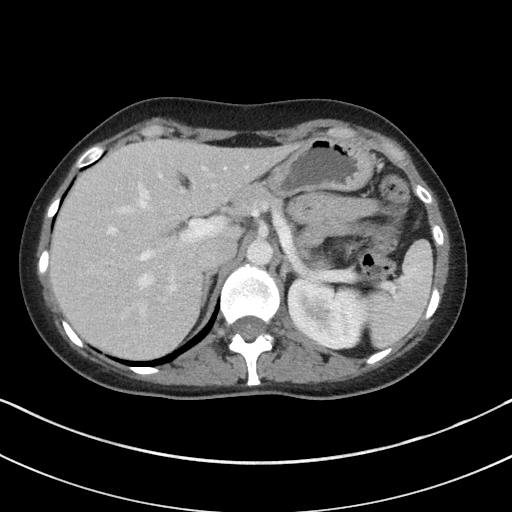
[im 83/90  soft-tissue]
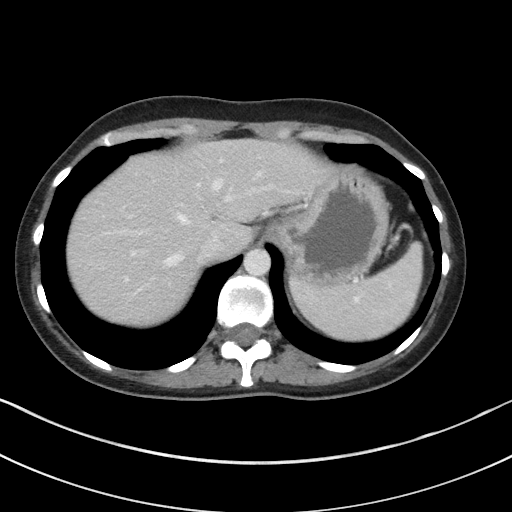

[Series 5: coronal st · coronal · 0.70mm/px · 3 of 74 slices shown]
[im 25/74  soft-tissue]
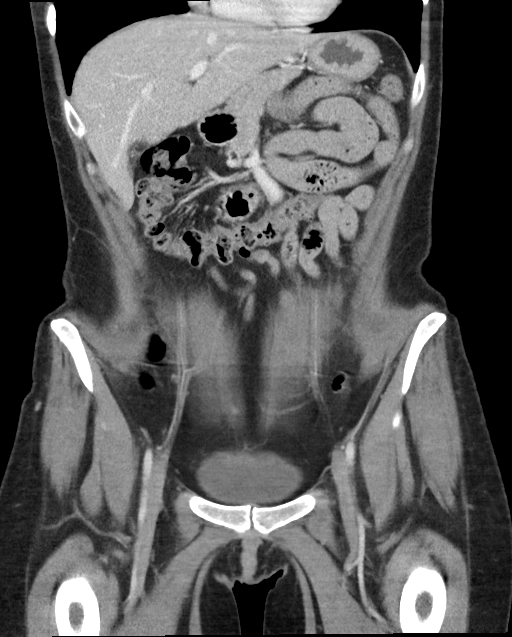
[im 33/74  soft-tissue]
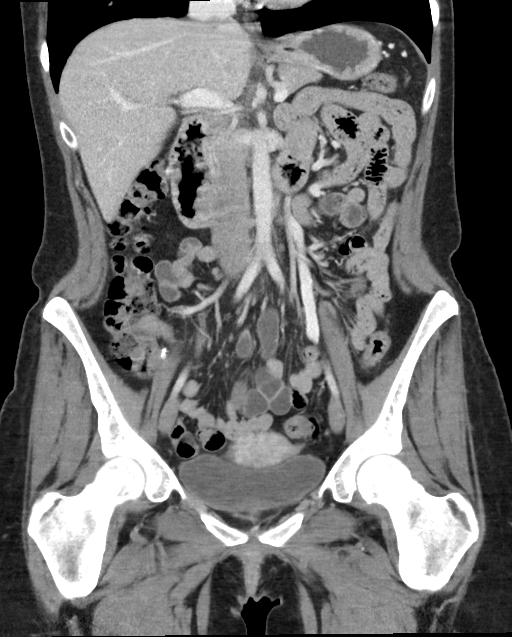
[im 41/74  soft-tissue]
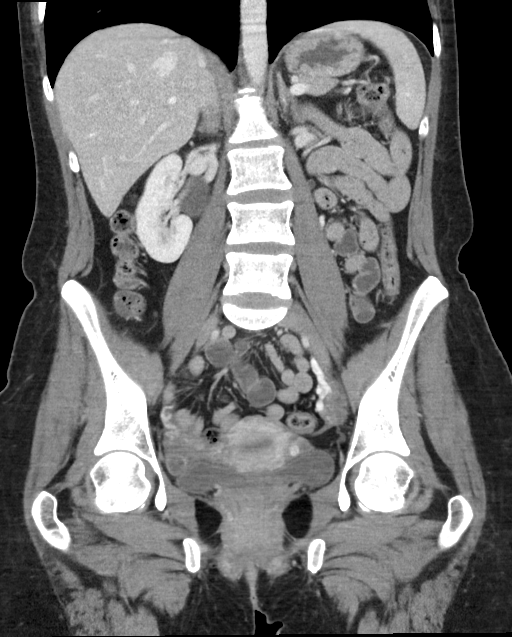

[16 of 46 positions shown; findings below may reference images not displayed]

FINDINGS: Lower chest: No acute abnormality.

Hepatobiliary: No focal liver abnormality is seen. Status post
cholecystectomy. No biliary dilatation.

Pancreas: Unremarkable. No pancreatic ductal dilatation or
surrounding inflammatory changes.

Spleen: Normal in size without focal abnormality.

Adrenals/Urinary Tract: Adrenal glands are unremarkable. A 10 mm
hypoattenuating lesion in the right kidney likely represents a cyst.
Otherwise, the kidneys are normal, without renal calculi, suspicious
focal lesion, or hydronephrosis. Bladder is unremarkable.

Stomach/Bowel: Stomach is within normal limits. The appendix is
surgically absent no evidence of bowel wall thickening, distention,
or inflammatory changes.

Vascular/Lymphatic: No significant vascular findings are present. No
enlarged abdominal or pelvic lymph nodes.

Reproductive: Uterus appears normal. A cyst in the right adnexa
measures 2.1 cm and is likely physiologic. No imaging follow-up is
recommended.

Other: There is subcutaneous fat stranding and overlying skin
thickening of the anterolateral right abdominal wall, likely
representing the site of prior surgical laparoscopy port incision.
No well-defined fluid collection to suggest abscess. No abdominal
wall hernia. No abdominopelvic ascites or free intraperitoneal air.

Musculoskeletal: A well-defined lytic lesion in the right aspect of
the iliac bone measures below fluid attenuation and likely
represents an intraosseous lipoma.
IMPRESSION: 1. Fat stranding and skin thickening of the anterolateral right
abdominal wall, likely reflecting the site of prior surgical
laparoscopy port incision. No well-defined fluid collection to
suggest abscess.
2. No acute process in the abdomen or pelvis to explain the
patient's symptoms.
